# Patient Record
Sex: Female | Born: 1940 | Race: White | Hispanic: No | Marital: Married | Smoking: Former smoker
Health system: Southern US, Community
[De-identification: ages and names within clinical notes are randomized; demographics above are authoritative.]

## PROBLEM LIST (undated history)

## (undated) DIAGNOSIS — K589 Irritable bowel syndrome without diarrhea: Secondary | ICD-10-CM

## (undated) DIAGNOSIS — R413 Other amnesia: Secondary | ICD-10-CM

## (undated) DIAGNOSIS — K219 Gastro-esophageal reflux disease without esophagitis: Secondary | ICD-10-CM

## (undated) DIAGNOSIS — I10 Essential (primary) hypertension: Secondary | ICD-10-CM

## (undated) DIAGNOSIS — F0280 Dementia in other diseases classified elsewhere without behavioral disturbance: Secondary | ICD-10-CM

## (undated) DIAGNOSIS — E785 Hyperlipidemia, unspecified: Secondary | ICD-10-CM

## (undated) DIAGNOSIS — M797 Fibromyalgia: Secondary | ICD-10-CM

## (undated) DIAGNOSIS — F419 Anxiety disorder, unspecified: Secondary | ICD-10-CM

## (undated) HISTORY — PX: GYNECOLOGIC CRYOSURGERY: SHX857

## (undated) HISTORY — PX: ESOPHAGOGASTRODUODENOSCOPY: SHX1529

## (undated) HISTORY — PX: TUBAL LIGATION: SHX77

## (undated) HISTORY — PX: COLONOSCOPY W/ POLYPECTOMY: SHX1380

## (undated) HISTORY — PX: BREAST BIOPSY: SHX20

---

## 2005-05-30 ENCOUNTER — Ambulatory Visit: Payer: Self-pay | Admitting: Unknown Physician Specialty

## 2005-10-10 ENCOUNTER — Emergency Department: Payer: Self-pay | Admitting: Emergency Medicine

## 2005-11-06 ENCOUNTER — Ambulatory Visit: Payer: Self-pay | Admitting: Internal Medicine

## 2006-11-13 ENCOUNTER — Ambulatory Visit: Payer: Self-pay | Admitting: Internal Medicine

## 2007-11-24 ENCOUNTER — Ambulatory Visit: Payer: Self-pay | Admitting: Internal Medicine

## 2008-09-19 ENCOUNTER — Ambulatory Visit: Payer: Self-pay | Admitting: Unknown Physician Specialty

## 2008-12-01 ENCOUNTER — Ambulatory Visit: Payer: Self-pay | Admitting: Internal Medicine

## 2009-12-22 ENCOUNTER — Ambulatory Visit: Payer: Self-pay | Admitting: Internal Medicine

## 2011-01-10 ENCOUNTER — Ambulatory Visit: Payer: Self-pay | Admitting: Internal Medicine

## 2012-03-10 ENCOUNTER — Ambulatory Visit: Payer: Self-pay | Admitting: Internal Medicine

## 2013-03-11 ENCOUNTER — Ambulatory Visit: Payer: Self-pay | Admitting: Internal Medicine

## 2013-10-30 ENCOUNTER — Emergency Department: Payer: Self-pay | Admitting: Emergency Medicine

## 2014-03-14 ENCOUNTER — Ambulatory Visit: Payer: Self-pay | Admitting: Internal Medicine

## 2014-09-06 ENCOUNTER — Encounter: Payer: Self-pay | Admitting: *Deleted

## 2014-09-09 ENCOUNTER — Ambulatory Visit
Admission: RE | Admit: 2014-09-09 | Payer: Medicare Other | Source: Ambulatory Visit | Admitting: Unknown Physician Specialty

## 2014-09-09 ENCOUNTER — Encounter: Admission: RE | Payer: Self-pay | Source: Ambulatory Visit

## 2014-09-09 HISTORY — DX: Fibromyalgia: M79.7

## 2014-09-09 HISTORY — DX: Hyperlipidemia, unspecified: E78.5

## 2014-09-09 HISTORY — DX: Irritable bowel syndrome, unspecified: K58.9

## 2014-09-09 HISTORY — DX: Gastro-esophageal reflux disease without esophagitis: K21.9

## 2014-09-09 SURGERY — COLONOSCOPY
Anesthesia: General

## 2014-10-21 ENCOUNTER — Ambulatory Visit: Payer: Medicare Other | Admitting: Anesthesiology

## 2014-10-21 ENCOUNTER — Ambulatory Visit
Admission: RE | Admit: 2014-10-21 | Discharge: 2014-10-21 | Disposition: A | Discharge status: Home / Self Care | Payer: Medicare Other | Source: Ambulatory Visit | Attending: Unknown Physician Specialty | Admitting: Unknown Physician Specialty

## 2014-10-21 ENCOUNTER — Encounter
Admission: RE | Disposition: A | Discharge status: Home / Self Care | Payer: Self-pay | Source: Ambulatory Visit | Attending: Unknown Physician Specialty

## 2014-10-21 ENCOUNTER — Encounter: Payer: Self-pay | Admitting: *Deleted

## 2014-10-21 DIAGNOSIS — K648 Other hemorrhoids: Secondary | ICD-10-CM | POA: Insufficient documentation

## 2014-10-21 DIAGNOSIS — K5730 Diverticulosis of large intestine without perforation or abscess without bleeding: Secondary | ICD-10-CM | POA: Diagnosis not present

## 2014-10-21 DIAGNOSIS — D125 Benign neoplasm of sigmoid colon: Secondary | ICD-10-CM | POA: Insufficient documentation

## 2014-10-21 DIAGNOSIS — D122 Benign neoplasm of ascending colon: Secondary | ICD-10-CM | POA: Diagnosis present

## 2014-10-21 DIAGNOSIS — E785 Hyperlipidemia, unspecified: Secondary | ICD-10-CM | POA: Diagnosis not present

## 2014-10-21 HISTORY — PX: COLONOSCOPY WITH PROPOFOL: SHX5780

## 2014-10-21 SURGERY — COLONOSCOPY WITH PROPOFOL
Anesthesia: General

## 2014-10-21 MED ORDER — SODIUM CHLORIDE 0.9 % IV SOLN
INTRAVENOUS | Status: DC
  Administered 2014-10-21: 12:00:00 via INTRAVENOUS

## 2014-10-21 MED ORDER — SODIUM CHLORIDE 0.9 % IV SOLN
INTRAVENOUS | Status: DC
  Administered 2014-10-21: 1000 mL via INTRAVENOUS

## 2014-10-21 MED ORDER — MIDAZOLAM HCL 2 MG/2ML IJ SOLN
INTRAMUSCULAR | Status: DC | PRN
  Administered 2014-10-21: 1 mg via INTRAVENOUS

## 2014-10-21 MED ORDER — PROPOFOL INFUSION 10 MG/ML OPTIME
INTRAVENOUS | Status: DC | PRN
  Administered 2014-10-21: 120 ug/kg/min via INTRAVENOUS

## 2014-10-21 NOTE — Op Note (Signed)
[facility] Gastroenterology Patient Name: Lindsay Chung Procedure Date: 10/21/2014 11:13 AM MRN: [id number] Account #: [id number] Date of Birth: 1941-01-14 Admit Type: Outpatient Age: 74 Room: [facility] ENDO ROOM 1 Gender: Female Note Status: Finalized Procedure:         Colonoscopy Indications:       Personal history of colonic polyps Providers:         Manya Silvas, MD Referring MD:      No Local Md, MD (Referring MD) Medicines:         Propofol per Anesthesia Complications:     No immediate complications. Procedure:         Pre-Anesthesia Assessment:                    - After reviewing the risks and benefits, the patient was                     deemed in satisfactory condition to undergo the procedure.                    After obtaining informed consent, the colonoscope was                     passed under direct vision. Throughout the procedure, the                     patient's blood pressure, pulse, and oxygen saturations                     were monitored continuously. The Colonoscope was                     introduced through the anus and advanced to the the cecum,                     identified by appendiceal orifice and ileocecal valve. The                     colonoscopy was technically difficult and complex due to                     restricted mobility of the colon and a tortuous colon.                     Successful completion of the procedure was aided by                     applying abdominal pressure. The patient tolerated the                     procedure well. The quality of the bowel preparation was                     good. Findings:      A diminutive polyp was found in the ascending colon. The polyp was       sessile. The polyp was removed with a jumbo cold forceps. Resection and       retrieval were complete.      A diminutive polyp was found in the ascending colon. The polyp was       sessile. The polyp was removed with a jumbo cold  forceps. Resection and       retrieval were complete.      A diminutive polyp was  found in the sigmoid colon. The polyp was       sessile. The polyp was removed with a jumbo cold forceps. Resection and       retrieval were complete.      A diminutive polyp was found in the sigmoid colon. The polyp was       sessile. The polyp was removed with a jumbo cold forceps. Resection and       retrieval were complete.      Multiple small-mouthed diverticula were found in the sigmoid colon, in       the descending colon, in the transverse colon and in the ascending colon.      The exam was otherwise without abnormality.      External and internal hemorrhoids were found during endoscopy. The       hemorrhoids were small and Grade II (internal hemorrhoids that prolapse       but reduce spontaneously).      The exam was otherwise without abnormality. Impression:        - One diminutive polyp in the ascending colon. Resected                     and retrieved.                    - One diminutive polyp in the ascending colon. Resected                     and retrieved.                    - One diminutive polyp in the sigmoid colon. Resected and                     retrieved.                    - One diminutive polyp in the sigmoid colon. Resected and                     retrieved.                    - Diverticulosis in the sigmoid colon, in the descending                     colon, in the transverse colon and in the ascending colon.                    - Internal hemorrhoids.                    - The examination was otherwise normal. Recommendation:    - Await pathology results.                    - The findings and recommendations were discussed with the                     patient's family. Manya Silvas, MD 10/21/2014 12:13:06 PM This report has been signed electronically. Number of Addenda: 0 Note Initiated On: 10/21/2014 11:13 AM Scope Withdrawal Time: 0 hours 20 minutes 24 seconds  Total  Procedure Duration: 0 hours 30 minutes 16 seconds       [facility]

## 2014-10-21 NOTE — H&P (Signed)
Primary Care Physician:  Brynda Greathouse, MD Primary Gastroenterologist:  Dr. Vira Agar  Pre-Procedure History & Physical: HPI:  Lindsay Chung is a 74 y.o. female is here for an colonoscopy.   Past Medical History  Diagnosis Date  . Hyperlipidemia   . GERD (gastroesophageal reflux disease)   . Fibromyalgia   . Irritable bowel syndrome     Past Surgical History  Procedure Laterality Date  . Colonoscopy w/ polypectomy    . Tubal ligation    . Gynecologic cryosurgery    . Esophagogastroduodenoscopy      Prior to Admission medications   Medication Sig Start Date End Date Taking? Authorizing Provider  acetaminophen (TYLENOL) 500 MG tablet Take 1,000 mg by mouth every 6 (six) hours as needed for mild pain.    Historical Provider, MD  ALPRAZolam Duanne Moron) 0.5 MG tablet Take 0.5 mg by mouth 2 (two) times daily. 08/10/14   Historical Provider, MD  aspirin 81 MG tablet Take 81 mg by mouth daily.    Historical Provider, MD  cetirizine (ZYRTEC) 10 MG tablet Take 10 mg by mouth daily.    Historical Provider, MD  cholecalciferol (VITAMIN D) 1000 UNITS tablet Take 1,000 Units by mouth daily.    Historical Provider, MD  FLUoxetine (PROZAC) 20 MG capsule Take 20 mg by mouth 2 (two) times daily. 05/25/14   Historical Provider, MD  fluvastatin (LESCOL) 20 MG capsule Take 20 mg by mouth at bedtime.    Historical Provider, MD  metoprolol tartrate (LOPRESSOR) 25 MG tablet Take 25 mg by mouth daily. 05/25/14   Historical Provider, MD  omeprazole (PRILOSEC) 20 MG capsule Take 20 mg by mouth daily. 06/12/14   Historical Provider, MD  ondansetron (ZOFRAN-ODT) 4 MG disintegrating tablet Take 1 tablet by mouth every 8 (eight) hours as needed for nausea.  07/13/14   Historical Provider, MD  polyethylene glycol-electrolytes (NULYTELY/GOLYTELY) 420 G solution Take 4,000 mLs by mouth once. 07/13/14   Historical Provider, MD    Allergies as of 09/26/2014  . (No Known Allergies)    History reviewed. No pertinent family  history.  History   Social History  . Marital Status: Married    Spouse Name: N/A  . Number of Children: N/A  . Years of Education: N/A   Occupational History  . Not on file.   Social History Main Topics  . Smoking status: Not on file  . Smokeless tobacco: Not on file  . Alcohol Use: Not on file  . Drug Use: Not on file  . Sexual Activity: Not on file   Other Topics Concern  . Not on file   Social History Narrative    Review of Systems: See HPI, otherwise negative ROS  Physical Exam: BP 140/72 mmHg  Pulse 70  Temp(Src) 96.8 F (36 C) (Tympanic)  Resp 17  Ht 5\' 2"  (1.575 m)  SpO2 98% General:   Alert,  pleasant and cooperative in NAD Head:  Normocephalic and atraumatic. Neck:  Supple; no masses or thyromegaly. Lungs:  Clear throughout to auscultation.    Heart:  Regular rate and rhythm. Abdomen:  Soft, nontender and nondistended. Normal bowel sounds, without guarding, and without rebound.   Neurologic:  Alert and  oriented x4;  grossly normal neurologically.  Impression/Plan: Lindsay Chung is here for an colonoscopy to be performed for personal history of colon polyps  Risks, benefits, limitations, and alternatives regarding  colonoscopy have Chung reviewed with the patient.  Questions have Chung answered.  All parties agreeable.  Gaylyn Cheers, MD  10/21/2014, 11:14 AM   Primary Care Physician:  Brynda Greathouse, MD Primary Gastroenterologist:  Dr. Vira Agar  Pre-Procedure History & Physical: HPI:  Lindsay Chung is a 74 y.o. female is here for an colonoscopy.   Past Medical History  Diagnosis Date  . Hyperlipidemia   . GERD (gastroesophageal reflux disease)   . Fibromyalgia   . Irritable bowel syndrome     Past Surgical History  Procedure Laterality Date  . Colonoscopy w/ polypectomy    . Tubal ligation    . Gynecologic cryosurgery    . Esophagogastroduodenoscopy      Prior to Admission medications   Medication Sig Start Date End Date Taking?  Authorizing Provider  acetaminophen (TYLENOL) 500 MG tablet Take 1,000 mg by mouth every 6 (six) hours as needed for mild pain.    Historical Provider, MD  ALPRAZolam Duanne Moron) 0.5 MG tablet Take 0.5 mg by mouth 2 (two) times daily. 08/10/14   Historical Provider, MD  aspirin 81 MG tablet Take 81 mg by mouth daily.    Historical Provider, MD  cetirizine (ZYRTEC) 10 MG tablet Take 10 mg by mouth daily.    Historical Provider, MD  cholecalciferol (VITAMIN D) 1000 UNITS tablet Take 1,000 Units by mouth daily.    Historical Provider, MD  FLUoxetine (PROZAC) 20 MG capsule Take 20 mg by mouth 2 (two) times daily. 05/25/14   Historical Provider, MD  fluvastatin (LESCOL) 20 MG capsule Take 20 mg by mouth at bedtime.    Historical Provider, MD  metoprolol tartrate (LOPRESSOR) 25 MG tablet Take 25 mg by mouth daily. 05/25/14   Historical Provider, MD  omeprazole (PRILOSEC) 20 MG capsule Take 20 mg by mouth daily. 06/12/14   Historical Provider, MD  ondansetron (ZOFRAN-ODT) 4 MG disintegrating tablet Take 1 tablet by mouth every 8 (eight) hours as needed for nausea.  07/13/14   Historical Provider, MD  polyethylene glycol-electrolytes (NULYTELY/GOLYTELY) 420 G solution Take 4,000 mLs by mouth once. 07/13/14   Historical Provider, MD    Allergies as of 09/26/2014  . (No Known Allergies)    History reviewed. No pertinent family history.  History   Social History  . Marital Status: Married    Spouse Name: N/A  . Number of Children: N/A  . Years of Education: N/A   Occupational History  . Not on file.   Social History Main Topics  . Smoking status: Not on file  . Smokeless tobacco: Not on file  . Alcohol Use: Not on file  . Drug Use: Not on file  . Sexual Activity: Not on file   Other Topics Concern  . Not on file   Social History Narrative    Review of Systems: See HPI, otherwise negative ROS  Physical Exam: BP 140/72 mmHg  Pulse 70  Temp(Src) 96.8 F (36 C) (Tympanic)  Resp 17  Ht 5'  2" (1.575 m)  SpO2 98% General:   Alert,  pleasant and cooperative in NAD Head:  Normocephalic and atraumatic. Neck:  Supple; no masses or thyromegaly. Lungs:  Clear throughout to auscultation.    Heart:  Regular rate and rhythm. Abdomen:  Soft, nontender and nondistended. Normal bowel sounds, without guarding, and without rebound.   Neurologic:  Alert and  oriented x4;  grossly normal neurologically.  Impression/Plan: Lindsay Chung is here for an colonoscopy to be performed for personal history of colon polyps  Risks, benefits, limitations, and alternatives regarding  colonoscopy have Chung reviewed with the patient.  Questions have Chung answered.  All parties agreeable.   Gaylyn Cheers, MD  10/21/2014, 11:14 AM

## 2014-10-21 NOTE — Anesthesia Preprocedure Evaluation (Addendum)
Anesthesia Evaluation  Patient identified by MRN, date of birth, ID band Patient awake    Reviewed: Allergy & Precautions, NPO status , Patient's Chart, lab work & pertinent test results, reviewed documented beta blocker date and time   Airway Mallampati: II  TM Distance: >3 FB     Dental  (+) Chipped   Pulmonary          Cardiovascular hypertension, Pt. on home beta blockers     Neuro/Psych  Neuromuscular disease    GI/Hepatic GERD-  ,  Endo/Other    Renal/GU      Musculoskeletal  (+) Fibromyalgia -  Abdominal   Peds  Hematology   Anesthesia Other Findings   Reproductive/Obstetrics                            Anesthesia Physical Anesthesia Plan  ASA: II  Anesthesia Plan: General   Post-op Pain Management:    Induction: Intravenous  Airway Management Planned: Nasal Cannula  Additional Equipment:   Intra-op Plan:   Post-operative Plan:   Informed Consent: I have reviewed the patients History and Physical, chart, labs and discussed the procedure including the risks, benefits and alternatives for the proposed anesthesia with the patient or authorized representative who has indicated his/her understanding and acceptance.     Plan Discussed with: CRNA  Anesthesia Plan Comments:         Anesthesia Quick Evaluation

## 2014-10-21 NOTE — Transfer of Care (Signed)
Immediate Anesthesia Transfer of Care Note  Patient: Lindsay Chung  Procedure(s) Performed: Procedure(s): COLONOSCOPY WITH PROPOFOL (N/A)  Patient Location: PACU and Endoscopy Unit  Anesthesia Type:General  Level of Consciousness: sedated, patient cooperative and responds to stimulation  Airway & Oxygen Therapy: Patient Spontanous Breathing and Patient connected to nasal cannula oxygen  Post-op Assessment: Report given to RN and Post -op Vital signs reviewed and stable  Post vital signs: Reviewed and stable  Last Vitals:  Filed Vitals:   10/21/14 1215  BP: 103/62  Pulse: 70  Temp: 36.5 C  Resp: 16    Complications: No apparent anesthesia complications

## 2014-10-21 NOTE — Anesthesia Postprocedure Evaluation (Signed)
  Anesthesia Post-op Note  Patient: Lindsay Chung  Procedure(s) Performed: Procedure(s): COLONOSCOPY WITH PROPOFOL (N/A)  Anesthesia type:General  Patient location: PACU  Post pain: Pain level controlled  Post assessment: Post-op Vital signs reviewed, Patient's Cardiovascular Status Stable, Respiratory Function Stable, Patent Airway and No signs of Nausea or vomiting  Post vital signs: Reviewed and stable  Last Vitals:  Filed Vitals:   10/21/14 1240  BP: 102/62  Pulse: 64  Temp:   Resp: 19    Level of consciousness: awake, alert  and patient cooperative  Complications: No apparent anesthesia complications

## 2014-10-24 ENCOUNTER — Encounter: Payer: Self-pay | Admitting: Unknown Physician Specialty

## 2014-10-25 LAB — SURGICAL PATHOLOGY

## 2016-04-12 ENCOUNTER — Other Ambulatory Visit: Payer: Self-pay | Admitting: Internal Medicine

## 2016-04-12 DIAGNOSIS — Z1231 Encounter for screening mammogram for malignant neoplasm of breast: Secondary | ICD-10-CM

## 2016-05-20 ENCOUNTER — Ambulatory Visit
Admission: RE | Admit: 2016-05-20 | Discharge: 2016-05-20 | Disposition: A | Discharge status: Home / Self Care | Payer: Medicare Other | Source: Ambulatory Visit | Attending: Internal Medicine | Admitting: Internal Medicine

## 2016-05-20 DIAGNOSIS — Z1231 Encounter for screening mammogram for malignant neoplasm of breast: Secondary | ICD-10-CM | POA: Insufficient documentation

## 2017-04-11 ENCOUNTER — Other Ambulatory Visit: Payer: Self-pay | Admitting: Internal Medicine

## 2017-04-11 DIAGNOSIS — Z1231 Encounter for screening mammogram for malignant neoplasm of breast: Secondary | ICD-10-CM

## 2017-05-30 ENCOUNTER — Ambulatory Visit
Admission: RE | Admit: 2017-05-30 | Discharge: 2017-05-30 | Disposition: A | Discharge status: Home / Self Care | Payer: Medicare Other | Source: Ambulatory Visit | Attending: Internal Medicine | Admitting: Internal Medicine

## 2017-05-30 DIAGNOSIS — Z1231 Encounter for screening mammogram for malignant neoplasm of breast: Secondary | ICD-10-CM | POA: Diagnosis present

## 2018-05-11 ENCOUNTER — Other Ambulatory Visit: Payer: Self-pay | Admitting: Internal Medicine

## 2018-05-11 DIAGNOSIS — Z1231 Encounter for screening mammogram for malignant neoplasm of breast: Secondary | ICD-10-CM

## 2018-06-01 ENCOUNTER — Other Ambulatory Visit: Payer: Self-pay | Admitting: Internal Medicine

## 2018-06-01 ENCOUNTER — Ambulatory Visit
Admission: RE | Admit: 2018-06-01 | Discharge: 2018-06-01 | Disposition: A | Discharge status: Home / Self Care | Payer: Medicare Other | Source: Ambulatory Visit | Attending: Internal Medicine | Admitting: Internal Medicine

## 2018-06-01 DIAGNOSIS — Z1231 Encounter for screening mammogram for malignant neoplasm of breast: Secondary | ICD-10-CM | POA: Insufficient documentation

## 2018-06-01 DIAGNOSIS — R928 Other abnormal and inconclusive findings on diagnostic imaging of breast: Secondary | ICD-10-CM

## 2018-06-01 DIAGNOSIS — N6489 Other specified disorders of breast: Secondary | ICD-10-CM

## 2018-06-01 DIAGNOSIS — N6453 Retraction of nipple: Secondary | ICD-10-CM

## 2018-06-10 ENCOUNTER — Ambulatory Visit
Admission: RE | Admit: 2018-06-10 | Discharge: 2018-06-10 | Disposition: A | Discharge status: Home / Self Care | Payer: Medicare Other | Source: Ambulatory Visit | Attending: Internal Medicine | Admitting: Internal Medicine

## 2018-06-10 ENCOUNTER — Other Ambulatory Visit: Payer: Self-pay

## 2018-06-10 DIAGNOSIS — R928 Other abnormal and inconclusive findings on diagnostic imaging of breast: Secondary | ICD-10-CM

## 2018-06-10 DIAGNOSIS — N6489 Other specified disorders of breast: Secondary | ICD-10-CM | POA: Insufficient documentation

## 2018-06-10 DIAGNOSIS — N6453 Retraction of nipple: Secondary | ICD-10-CM

## 2018-06-12 ENCOUNTER — Other Ambulatory Visit: Payer: Self-pay | Admitting: Internal Medicine

## 2018-06-12 DIAGNOSIS — N6489 Other specified disorders of breast: Secondary | ICD-10-CM

## 2018-06-12 DIAGNOSIS — R928 Other abnormal and inconclusive findings on diagnostic imaging of breast: Secondary | ICD-10-CM

## 2018-06-16 ENCOUNTER — Other Ambulatory Visit: Payer: Self-pay

## 2018-06-16 ENCOUNTER — Ambulatory Visit
Admission: RE | Admit: 2018-06-16 | Discharge: 2018-06-16 | Disposition: A | Discharge status: Home / Self Care | Payer: Medicare Other | Source: Ambulatory Visit | Attending: Internal Medicine | Admitting: Internal Medicine

## 2018-06-16 DIAGNOSIS — R928 Other abnormal and inconclusive findings on diagnostic imaging of breast: Secondary | ICD-10-CM | POA: Diagnosis present

## 2018-06-16 DIAGNOSIS — N6489 Other specified disorders of breast: Secondary | ICD-10-CM

## 2018-06-16 HISTORY — PX: BREAST BIOPSY: SHX20

## 2018-06-17 LAB — SURGICAL PATHOLOGY

## 2018-10-05 ENCOUNTER — Emergency Department
Admission: EM | Admit: 2018-10-05 | Discharge: 2018-10-05 | Disposition: A | Discharge status: Home / Self Care | Payer: Medicare Other | Attending: Emergency Medicine | Admitting: Emergency Medicine

## 2018-10-05 ENCOUNTER — Emergency Department: Payer: Medicare Other

## 2018-10-05 ENCOUNTER — Encounter: Payer: Self-pay | Admitting: Emergency Medicine

## 2018-10-05 ENCOUNTER — Other Ambulatory Visit: Payer: Self-pay

## 2018-10-05 DIAGNOSIS — F17200 Nicotine dependence, unspecified, uncomplicated: Secondary | ICD-10-CM | POA: Insufficient documentation

## 2018-10-05 DIAGNOSIS — Z79899 Other long term (current) drug therapy: Secondary | ICD-10-CM | POA: Insufficient documentation

## 2018-10-05 DIAGNOSIS — F419 Anxiety disorder, unspecified: Secondary | ICD-10-CM | POA: Diagnosis not present

## 2018-10-05 DIAGNOSIS — Z7982 Long term (current) use of aspirin: Secondary | ICD-10-CM | POA: Insufficient documentation

## 2018-10-05 DIAGNOSIS — R0602 Shortness of breath: Secondary | ICD-10-CM | POA: Insufficient documentation

## 2018-10-05 DIAGNOSIS — R0600 Dyspnea, unspecified: Secondary | ICD-10-CM

## 2018-10-05 HISTORY — DX: Anxiety disorder, unspecified: F41.9

## 2018-10-05 LAB — CBC
HCT: 38.6 % (ref 36.0–46.0)
Hemoglobin: 13 g/dL (ref 12.0–15.0)
MCH: 31.6 pg (ref 26.0–34.0)
MCHC: 33.7 g/dL (ref 30.0–36.0)
MCV: 93.7 fL (ref 80.0–100.0)
Platelets: 215 10*3/uL (ref 150–400)
RBC: 4.12 MIL/uL (ref 3.87–5.11)
RDW: 13.5 % (ref 11.5–15.5)
WBC: 6.2 10*3/uL (ref 4.0–10.5)
nRBC: 0 % (ref 0.0–0.2)

## 2018-10-05 LAB — BASIC METABOLIC PANEL
Anion gap: 9 (ref 5–15)
BUN: 9 mg/dL (ref 8–23)
CO2: 25 mmol/L (ref 22–32)
Calcium: 6.9 mg/dL — ABNORMAL LOW (ref 8.9–10.3)
Chloride: 109 mmol/L (ref 98–111)
Creatinine, Ser: 0.8 mg/dL (ref 0.44–1.00)
GFR calc Af Amer: >60 mL/min (ref >60)
GFR calc non Af Amer: >60 mL/min (ref >60)
Glucose, Bld: 118 mg/dL — ABNORMAL HIGH (ref 70–99)
Potassium: 3 mmol/L — ABNORMAL LOW (ref 3.5–5.1)
Sodium: 143 mmol/L (ref 135–145)

## 2018-10-05 LAB — TROPONIN I (HIGH SENSITIVITY)
Troponin I (High Sensitivity): 10 ng/L (ref <18)
Troponin I (High Sensitivity): 10 ng/L (ref <18)

## 2018-10-05 NOTE — ED Notes (Signed)
Pt ambulating around waiting room with slow steady gait;

## 2018-10-05 NOTE — ED Provider Notes (Signed)
[facility] Emergency Department Provider Note  Time seen: 7:36 AM  I have reviewed the triage vital signs and the nursing notes.   HISTORY  Chief Complaint Shortness of Breath and Anxiety   HPI Lindsay Chung is a 78 y.o. female with a past medical history anxiety, fibromyalgia, gastric reflux, hyperlipidemia presents to the emergency department for shortness of breath.  According to the patient overnight she developed some mild shortness of breath.  She states she is not sure if it was her panic attack or if it was something to do with her heart or lungs.  Patient waited several hours in the waiting room before my evaluation, currently she states no difficulty breathing whatsoever, no chest pain.  Patient denies any recent fever cough or congestion.   Denies any chest pain now or at any point.  Past Medical History:  Diagnosis Date  . Anxiety   . Fibromyalgia   . GERD (gastroesophageal reflux disease)   . Hyperlipidemia   . Irritable bowel syndrome     There are no active problems to display for this patient.   Past Surgical History:  Procedure Laterality Date  . BREAST BIOPSY Left    neg  . BREAST BIOPSY Left 06/16/2018   affirm stereo biopsy/ x clip/ path pending  . COLONOSCOPY W/ POLYPECTOMY    . COLONOSCOPY WITH PROPOFOL N/A 10/21/2014   Procedure: COLONOSCOPY WITH PROPOFOL;  Surgeon: Manya Silvas, MD;  Location: [facility] ENDOSCOPY;  Service: Endoscopy;  Laterality: N/A;  . ESOPHAGOGASTRODUODENOSCOPY    . GYNECOLOGIC CRYOSURGERY    . TUBAL LIGATION      Prior to Admission medications   Medication Sig Start Date End Date Taking? Authorizing Provider  acetaminophen (TYLENOL) 500 MG tablet Take 1,000 mg by mouth every 6 (six) hours as needed for mild pain.    [provider]  ALPRAZolam Duanne Moron) 0.5 MG tablet Take 0.5 mg by mouth 2 (two) times daily. 08/10/14   [provider]  aspirin 81 MG tablet Take 81 mg by mouth daily.     [provider]  cetirizine (ZYRTEC) 10 MG tablet Take 10 mg by mouth daily.    [provider]  cholecalciferol (VITAMIN D) 1000 UNITS tablet Take 1,000 Units by mouth daily.    [provider]  FLUoxetine (PROZAC) 20 MG capsule Take 20 mg by mouth 2 (two) times daily. 05/25/14   [provider]  fluvastatin (LESCOL) 20 MG capsule Take 20 mg by mouth at bedtime.    [provider]  metoprolol tartrate (LOPRESSOR) 25 MG tablet Take 25 mg by mouth daily. 05/25/14   [provider]  omeprazole (PRILOSEC) 20 MG capsule Take 20 mg by mouth daily. 06/12/14   [provider]  ondansetron (ZOFRAN-ODT) 4 MG disintegrating tablet Take 1 tablet by mouth every 8 (eight) hours as needed for nausea.  07/13/14   [provider]  polyethylene glycol-electrolytes (NULYTELY/GOLYTELY) 420 G solution Take 4,000 mLs by mouth once. 07/13/14   [provider]    No Known Allergies  Family History  Problem Relation Age of Onset  . Breast cancer Neg Hx     Social History Social History   Tobacco Use  . Smoking status: Current Every Day Smoker  . Smokeless tobacco: Never Used  Substance Use Topics  . Alcohol use: Not on file  . Drug use: Not on file    Review of Systems Constitutional: Negative for fever. Cardiovascular: Negative for chest pain.  Respiratory: Breath overnight, now resolved Gastrointestinal: Negative for abdominal pain, vomiting  Musculoskeletal: Negative for musculoskeletal complaints Skin: Negative for skin complaints  Neurological: Negative for headache All other ROS negative  ____________________________________________   PHYSICAL EXAM:  VITAL SIGNS: ED Triage Vitals  Enc Vitals Group     BP 10/05/18 0500 135/84     Pulse Rate 10/05/18 0500 68     Resp 10/05/18 0500 18     Temp 10/05/18 0500 98.9 F (37.2 C)     Temp Source 10/05/18 0500 Oral     SpO2 10/05/18 0454 100 %     Weight 10/05/18  0500 180 lb (81.6 kg)     Height 10/05/18 0500 5\' 3"  (1.6 m)     Head Circumference --      Peak Flow --      Pain Score 10/05/18 0500 0     Pain Loc --      Pain Edu? --      Excl. in [place]? --    Constitutional: Alert and oriented. Well appearing and in no distress. Eyes: Normal exam ENT      Head: Normocephalic and atraumatic.      Mouth/Throat: Mucous membranes are moist. Cardiovascular: Normal rate, regular rhythm. Respiratory: Normal respiratory effort without tachypnea nor retractions. Breath sounds are clear  Gastrointestinal: Soft and nontender. No distention.  Musculoskeletal: Nontender with normal range of motion in all extremities.  Neurologic:  Normal speech and language. No gross focal neurologic deficits  Skin:  Skin is warm, dry and intact.  Psychiatric: Mood and affect are normal.  ____________________________________________    EKG  EKG viewed and interpreted by myself shows a normal sinus rhythm at 70 bpm with a narrow QRS, normal axis, normal intervals, no concerning ST changes.  ____________________________________________    RADIOLOGY  Chest x-ray shows cardiomegaly without acute finding.  ____________________________________________   INITIAL IMPRESSION / ASSESSMENT AND PLAN / ED COURSE  Pertinent labs & imaging results that were available during my care of the patient were reviewed by me and considered in my medical decision making (see chart for details).   Patient presents to the emergency department for shortness of breath since early this morning.  Patient states she thinks it could have been 1 of her "panic attacks."  Differential would include anxiety, ACS, pneumonia, pneumothorax, URI.  Patient denies any difficulty breathing right now.  Denies any chest pain currently or at any point overnight.  Overall the patient appears well, she is asking when she can be discharged home.  Patient's lab work is thus far nonrevealing.  Given the patient's  complaint of shortness of breath overnight we will repeat a troponin as a precaution.  If the patient's repeat troponin remains negative anticipate likely discharge home with PCP follow-up.  Patient is agreeable to this plan of care.   Patient's repeat troponin is unchanged.  Patient continues to feel well with no dyspnea.  We will discharge from the emergency department with PCP follow-up.  JILENE SPOHR was evaluated in Emergency Department on 10/05/2018 for the symptoms described in the history of present illness. She was evaluated in the context of the global COVID-19 pandemic, which necessitated consideration that the patient might be at risk for infection with the SARS-CoV-2 virus that causes COVID-19. Institutional protocols and algorithms that pertain to the evaluation of patients at risk for COVID-19 are in a state of rapid change based on information released by regulatory bodies including the CDC and federal and state  organizations. These policies and algorithms were followed during the patient's care in the ED.  ____________________________________________   FINAL CLINICAL IMPRESSION(S) / ED DIAGNOSES  Dyspnea   Harvest Dark, MD 10/05/18 [phone]

## 2018-10-05 NOTE — ED Triage Notes (Signed)
EMS pt from home brought out to waiting room via wheelchair; pt reports trouble breathing with arm tingling; EMS had actually been out to pt's house earlier in the evening for the same; pt with history of anxiety and says this is what it feels like tonight; NSR on the monitor; pt arrives awake and alert

## 2018-10-09 ENCOUNTER — Other Ambulatory Visit: Payer: Self-pay

## 2018-10-09 ENCOUNTER — Emergency Department
Admission: EM | Admit: 2018-10-09 | Discharge: 2018-10-09 | Disposition: A | Discharge status: Home / Self Care | Payer: Medicare Other | Attending: Emergency Medicine | Admitting: Emergency Medicine

## 2018-10-09 DIAGNOSIS — R61 Generalized hyperhidrosis: Secondary | ICD-10-CM | POA: Diagnosis present

## 2018-10-09 DIAGNOSIS — Z79899 Other long term (current) drug therapy: Secondary | ICD-10-CM | POA: Insufficient documentation

## 2018-10-09 DIAGNOSIS — Z87891 Personal history of nicotine dependence: Secondary | ICD-10-CM | POA: Diagnosis not present

## 2018-10-09 DIAGNOSIS — Z7982 Long term (current) use of aspirin: Secondary | ICD-10-CM | POA: Diagnosis not present

## 2018-10-09 DIAGNOSIS — F410 Panic disorder [episodic paroxysmal anxiety] without agoraphobia: Secondary | ICD-10-CM

## 2018-10-09 NOTE — Discharge Instructions (Addendum)
You have been seen in the Emergency Department (ED) today for a variety of symptoms.  As we have discussed, we feel it is likely that panic attacks may be causing your symptoms.  Please follow up with the recommended doctor as instructed above in these documents regarding todays emergent visit and your recent symptoms to discuss further management.  Continue to take your regular medications. If you are not doing so already, consider taking a daily baby aspirin (81 mg), at least until you follow up with your doctor.  Return to the Emergency Department (ED) if you experience any further chest pain/pressure/tightness, difficulty breathing, or sudden sweating, or other symptoms that concern you.

## 2018-10-09 NOTE — ED Provider Notes (Signed)
[facility] Emergency Department Provider Note  ____________________________________________   First MD Initiated Contact with Patient 10/09/18 0510     (approximate)  I have reviewed the triage vital signs and the nursing notes.   HISTORY  Chief Complaint Excessive Sweating    HPI Lindsay Chung is a 78 y.o. female with medical history as listed below who presents for evaluation of rapid breathing, some tightness in her chest, and excessive sweating.  She says it feels like a panic attack.  She was seen a few days ago for similar symptoms and she also followed up with her primary care doctor.  Her primary care doctor started her on a medicine to help with anxiety which she took once or twice, but then she stopped taking it because she said it made her stomach feel bad.  She did not take a dose today and then the symptoms were worse tonight.  After coming to the emergency department they completely resolved as they did last time.  Nothing in particular made the symptoms better or worse.  She said that when the symptoms are going on she feels very worried but then she feels better once they have resolved.  She has not been in contact with any COVID-19 patients.  She is not having any pain, just a general sense of anxiety.  She says she is always suffering from anxiety but it seems like it is getting worse.         Past Medical History:  Diagnosis Date  . Anxiety   . Fibromyalgia   . GERD (gastroesophageal reflux disease)   . Hyperlipidemia   . Irritable bowel syndrome     There are no active problems to display for this patient.   Past Surgical History:  Procedure Laterality Date  . BREAST BIOPSY Left    neg  . BREAST BIOPSY Left 06/16/2018   affirm stereo biopsy/ x clip/ path pending  . COLONOSCOPY W/ POLYPECTOMY    . COLONOSCOPY WITH PROPOFOL N/A 10/21/2014   Procedure: COLONOSCOPY WITH PROPOFOL;  Surgeon: Manya Silvas, MD;  Location: [facility]  ENDOSCOPY;  Service: Endoscopy;  Laterality: N/A;  . ESOPHAGOGASTRODUODENOSCOPY    . GYNECOLOGIC CRYOSURGERY    . TUBAL LIGATION      Prior to Admission medications   Medication Sig Start Date End Date Taking? Authorizing Provider  acetaminophen (TYLENOL) 500 MG tablet Take 1,000 mg by mouth every 6 (six) hours as needed for mild pain.    [provider]  ALPRAZolam Duanne Moron) 0.5 MG tablet Take 0.5 mg by mouth 2 (two) times daily. 08/10/14   [provider]  aspirin 81 MG tablet Take 81 mg by mouth daily.    [provider]  cetirizine (ZYRTEC) 10 MG tablet Take 10 mg by mouth daily.    [provider]  Cholecalciferol (VITAMIN D) 50 MCG (2000 UT) tablet Take 2,000 Units by mouth daily.     [provider]  FLUoxetine (PROZAC) 20 MG capsule Take 20 mg by mouth 2 (two) times daily. 05/25/14   [provider]  fluvastatin (LESCOL) 40 MG capsule Take 40 mg by mouth at bedtime.     [provider]  metoprolol tartrate (LOPRESSOR) 25 MG tablet Take 25 mg by mouth 2 (two) times daily.     [provider]  omeprazole (PRILOSEC) 20 MG capsule Take 20 mg by mouth daily. 06/12/14   [provider]    Allergies Patient has no known allergies.  Family History  Problem Relation Age of Onset  . Breast cancer Neg Hx     Social History Social History   Tobacco Use  . Smoking status: Former Research scientist (life sciences)  . Smokeless tobacco: Never Used  Substance Use Topics  . Alcohol use: Not Currently  . Drug use: Not Currently    Review of Systems Constitutional: No fever/chills Eyes: No visual changes. ENT: No sore throat. Cardiovascular: No chest pain, some chest tightness when she is feeling anxious Respiratory: Shortness of breath when she is feeling anxious Gastrointestinal: No abdominal pain.  No nausea, no vomiting.  No diarrhea.  No constipation. Genitourinary: Negative for dysuria. Musculoskeletal: Negative for neck pain.   Negative for back pain. Integumentary: Negative for rash. Neurological: Negative for headaches, focal weakness or numbness.   ____________________________________________   PHYSICAL EXAM:  VITAL SIGNS: ED Triage Vitals  Enc Vitals Group     BP 10/09/18 0442 (!) 164/91     Pulse Rate 10/09/18 0442 81     Resp 10/09/18 0442 18     Temp 10/09/18 0442 97.8 F (36.6 C)     Temp Source 10/09/18 0442 Oral     SpO2 10/09/18 0442 100 %     Weight 10/09/18 0441 80 kg (176 lb 5.9 oz)     Height 10/09/18 0441 1.6 m (5\' 3" )     Head Circumference --      Peak Flow --      Pain Score 10/09/18 0440 0     Pain Loc --      Pain Edu? --      Excl. in [place]? --     Constitutional: Alert and oriented. Well appearing and in no acute distress. Eyes: Conjunctivae are normal.  Head: Atraumatic. Nose: No congestion/rhinnorhea. Mouth/Throat: Mucous membranes are moist. Neck: No stridor.  No meningeal signs.   Cardiovascular: Normal rate, regular rhythm. Good peripheral circulation. Grossly normal heart sounds. Respiratory: Normal respiratory effort.  No retractions. No audible wheezing. Gastrointestinal: Soft and nontender. No distention.  Musculoskeletal: No lower extremity tenderness nor edema. No gross deformities of extremities. Neurologic:  Normal speech and language. No gross focal neurologic deficits are appreciated.  Skin:  Skin is warm, dry and intact. No rash noted. Psychiatric: Mood and affect are normal. Speech and behavior are normal.  ____________________________________________   LABS (all labs ordered are listed, but only abnormal results are displayed)  Labs Reviewed - No data to display ____________________________________________  EKG  ED ECG REPORT I, Hinda Kehr, the attending physician, personally viewed and interpreted this ECG.  Date: 10/09/2018 EKG Time: 4:46 AM Rate: 75 Rhythm: normal sinus rhythm QRS Axis: normal Intervals: Slightly prolonged QTC of 529 ms  ST/T Wave abnormalities: Nonspecific ST changes including some slight depression in lead II as well as some mild ST depression in leads V4, V5, and V6, otherwise unremarkable. Narrative Interpretation: no evidence of acute ischemia.  No significant change from prior EKG a few days ago.  ____________________________________________  RADIOLOGY   ED MD interpretation: No indication for imaging  Official radiology report(s): No results found.  ____________________________________________   PROCEDURES   Procedure(s) performed (including Critical Care):  Procedures   ____________________________________________   INITIAL IMPRESSION / MDM / [place] / ED COURSE  As part of my medical decision making, I reviewed the following data within the [place] notes reviewed and incorporated, EKG interpreted , Old EKG reviewed, Old chart reviewed and Notes from prior ED visits   Differential  diagnosis includes, but is not limited to, anxiety, PE, ACS, medication side effects, dementia/delirium.  The patient is well-appearing and in no distress with stable vital signs.  She admits repeatedly to to being anxious.  She says she did not realize the panic attacks can make her feel like this.  I confirmed that her symptoms are the same as she had previously and about which she discussed with her primary care doctor.  Her EKG is reassuring and essentially unchanged from prior.  She is completely asymptomatic and has been asymptomatic since coming to the ED.  I offered to repeat all the lab work and even chest x-ray that was done before, but she declines.  I think this is appropriate because after I talked with her for a little while she seems more convinced that this is in fact anxiety/panic attacks whereas while she was at home she was concerned it might be something else.  On 2 separate occasions I offered to obtain lab work and perform a standard cardiac work-up but  she declines.  She is comfortable with the plan to go home.  She asked for contact information for psychiatry and I am giving her the name and number of [place].  I gave her strict return precautions and she understands and agrees with the plan.          ____________________________________________  FINAL CLINICAL IMPRESSION(S) / ED DIAGNOSES  Final diagnoses:  Panic attack     MEDICATIONS GIVEN DURING THIS VISIT:  Medications - No data to display   ED Discharge Orders    None      *Please note:  Lindsay Chung was evaluated in Emergency Department on 10/09/2018 for the symptoms described in the history of present illness. She was evaluated in the context of the global COVID-19 pandemic, which necessitated consideration that the patient might be at risk for infection with the SARS-CoV-2 virus that causes COVID-19. Institutional protocols and algorithms that pertain to the evaluation of patients at risk for COVID-19 are in a state of rapid change based on information released by regulatory bodies including the CDC and federal and state organizations. These policies and algorithms were followed during the patient's care in the ED.  Some ED evaluations and interventions may be delayed as a result of limited staffing during the pandemic.*  Note:  This document was prepared using Dragon voice recognition software and may include unintentional dictation errors.   Hinda Kehr, MD 10/09/18 [phone]

## 2018-10-09 NOTE — ED Triage Notes (Signed)
Pt to the er for excessive sweating and possible anxiety attack. Pt is poor historian. Pt seen here a few days ago and has followed up with her mD.

## 2019-01-27 ENCOUNTER — Other Ambulatory Visit: Payer: Self-pay | Admitting: Acute Care

## 2019-01-27 DIAGNOSIS — R413 Other amnesia: Secondary | ICD-10-CM

## 2019-02-09 ENCOUNTER — Other Ambulatory Visit: Payer: Self-pay

## 2019-02-09 ENCOUNTER — Ambulatory Visit
Admission: RE | Admit: 2019-02-09 | Discharge: 2019-02-09 | Disposition: A | Discharge status: Home / Self Care | Payer: Medicare Other | Source: Ambulatory Visit | Attending: Acute Care | Admitting: Acute Care

## 2019-02-09 DIAGNOSIS — R413 Other amnesia: Secondary | ICD-10-CM | POA: Diagnosis not present

## 2019-03-26 ENCOUNTER — Emergency Department: Payer: Medicare Other

## 2019-03-26 ENCOUNTER — Observation Stay: Payer: Medicare Other

## 2019-03-26 ENCOUNTER — Other Ambulatory Visit: Payer: Self-pay

## 2019-03-26 ENCOUNTER — Inpatient Hospital Stay
Admission: EM | Admit: 2019-03-26 | Discharge: 2019-03-28 | DRG: 641 | Disposition: A | Discharge status: Home Health | Payer: Medicare Other | Attending: Family Medicine | Admitting: Family Medicine

## 2019-03-26 ENCOUNTER — Encounter: Payer: Self-pay | Admitting: Emergency Medicine

## 2019-03-26 DIAGNOSIS — F411 Generalized anxiety disorder: Secondary | ICD-10-CM | POA: Diagnosis present

## 2019-03-26 DIAGNOSIS — F0390 Unspecified dementia without behavioral disturbance: Secondary | ICD-10-CM | POA: Diagnosis present

## 2019-03-26 DIAGNOSIS — F419 Anxiety disorder, unspecified: Secondary | ICD-10-CM | POA: Diagnosis present

## 2019-03-26 DIAGNOSIS — R630 Anorexia: Secondary | ICD-10-CM | POA: Diagnosis present

## 2019-03-26 DIAGNOSIS — M797 Fibromyalgia: Secondary | ICD-10-CM | POA: Diagnosis present

## 2019-03-26 DIAGNOSIS — Z79899 Other long term (current) drug therapy: Secondary | ICD-10-CM

## 2019-03-26 DIAGNOSIS — R197 Diarrhea, unspecified: Secondary | ICD-10-CM | POA: Diagnosis present

## 2019-03-26 DIAGNOSIS — Z881 Allergy status to other antibiotic agents status: Secondary | ICD-10-CM

## 2019-03-26 DIAGNOSIS — G9349 Other encephalopathy: Secondary | ICD-10-CM | POA: Diagnosis present

## 2019-03-26 DIAGNOSIS — K219 Gastro-esophageal reflux disease without esophagitis: Secondary | ICD-10-CM | POA: Diagnosis present

## 2019-03-26 DIAGNOSIS — K589 Irritable bowel syndrome without diarrhea: Secondary | ICD-10-CM | POA: Diagnosis present

## 2019-03-26 DIAGNOSIS — E876 Hypokalemia: Secondary | ICD-10-CM | POA: Diagnosis present

## 2019-03-26 DIAGNOSIS — E785 Hyperlipidemia, unspecified: Secondary | ICD-10-CM | POA: Diagnosis present

## 2019-03-26 DIAGNOSIS — Z87891 Personal history of nicotine dependence: Secondary | ICD-10-CM

## 2019-03-26 DIAGNOSIS — Z7982 Long term (current) use of aspirin: Secondary | ICD-10-CM

## 2019-03-26 DIAGNOSIS — R110 Nausea: Secondary | ICD-10-CM | POA: Diagnosis not present

## 2019-03-26 DIAGNOSIS — Z20828 Contact with and (suspected) exposure to other viral communicable diseases: Secondary | ICD-10-CM | POA: Diagnosis present

## 2019-03-26 DIAGNOSIS — D72829 Elevated white blood cell count, unspecified: Secondary | ICD-10-CM | POA: Diagnosis present

## 2019-03-26 DIAGNOSIS — K5730 Diverticulosis of large intestine without perforation or abscess without bleeding: Secondary | ICD-10-CM | POA: Diagnosis present

## 2019-03-26 LAB — TROPONIN I (HIGH SENSITIVITY)
Troponin I (High Sensitivity): 20 ng/L — ABNORMAL HIGH (ref <18)
Troponin I (High Sensitivity): 21 ng/L — ABNORMAL HIGH (ref <18)

## 2019-03-26 LAB — HEPATIC FUNCTION PANEL
ALT: 14 U/L (ref 0–44)
ALT: 13 U/L (ref 0–44)
AST: 52 U/L — ABNORMAL HIGH (ref 15–41)
AST: 48 U/L — ABNORMAL HIGH (ref 15–41)
Albumin: 3.9 g/dL (ref 3.5–5.0)
Albumin: 3.4 g/dL — ABNORMAL LOW (ref 3.5–5.0)
Alkaline Phosphatase: 99 U/L (ref 38–126)
Alkaline Phosphatase: 93 U/L (ref 38–126)
Bilirubin, Direct: 0.2 mg/dL (ref 0.0–0.2)
Bilirubin, Direct: 0.1 mg/dL (ref 0.0–0.2)
Indirect Bilirubin: 0.8 mg/dL (ref 0.3–0.9)
Indirect Bilirubin: 0.7 mg/dL (ref 0.3–0.9)
Total Bilirubin: 1 mg/dL (ref 0.3–1.2)
Total Bilirubin: 0.8 mg/dL (ref 0.3–1.2)
Total Protein: 7.2 g/dL (ref 6.5–8.1)
Total Protein: 6.6 g/dL (ref 6.5–8.1)

## 2019-03-26 LAB — URINALYSIS, COMPLETE (UACMP) WITH MICROSCOPIC
Bilirubin Urine: NEGATIVE
Glucose, UA: NEGATIVE mg/dL
Ketones, ur: 20 mg/dL — AB
Nitrite: NEGATIVE
Protein, ur: 30 mg/dL — AB
Specific Gravity, Urine: 1.013 (ref 1.005–1.030)
Waxy Casts, UA: 1
pH: 6 (ref 5.0–8.0)

## 2019-03-26 LAB — RESPIRATORY PANEL BY RT PCR (FLU A&B, COVID)
Influenza A by PCR: NEGATIVE
Influenza B by PCR: NEGATIVE
SARS Coronavirus 2 by RT PCR: NEGATIVE

## 2019-03-26 LAB — RENAL FUNCTION PANEL
Albumin: 3.7 g/dL (ref 3.5–5.0)
Anion gap: 20 — ABNORMAL HIGH (ref 5–15)
BUN: 22 mg/dL (ref 8–23)
CO2: 18 mmol/L — ABNORMAL LOW (ref 22–32)
Calcium: 5.5 mg/dL — CL (ref 8.9–10.3)
Chloride: 103 mmol/L (ref 98–111)
Creatinine, Ser: 1.12 mg/dL — ABNORMAL HIGH (ref 0.44–1.00)
GFR calc Af Amer: 54 mL/min — ABNORMAL LOW (ref >60)
GFR calc non Af Amer: 47 mL/min — ABNORMAL LOW (ref >60)
Glucose, Bld: 105 mg/dL — ABNORMAL HIGH (ref 70–99)
Phosphorus: 4.4 mg/dL (ref 2.5–4.6)
Potassium: 3 mmol/L — ABNORMAL LOW (ref 3.5–5.1)
Sodium: 141 mmol/L (ref 135–145)

## 2019-03-26 LAB — CBC
HCT: 42.8 % (ref 36.0–46.0)
Hemoglobin: 14.7 g/dL (ref 12.0–15.0)
MCH: 31.6 pg (ref 26.0–34.0)
MCHC: 34.3 g/dL (ref 30.0–36.0)
MCV: 92 fL (ref 80.0–100.0)
Platelets: 305 10*3/uL (ref 150–400)
RBC: 4.65 MIL/uL (ref 3.87–5.11)
RDW: 13.4 % (ref 11.5–15.5)
WBC: 11.8 10*3/uL — ABNORMAL HIGH (ref 4.0–10.5)
nRBC: 0 % (ref 0.0–0.2)

## 2019-03-26 LAB — PHOSPHORUS: Phosphorus: 4.5 mg/dL (ref 2.5–4.6)

## 2019-03-26 LAB — BASIC METABOLIC PANEL
Anion gap: 17 — ABNORMAL HIGH (ref 5–15)
BUN: 23 mg/dL (ref 8–23)
CO2: 21 mmol/L — ABNORMAL LOW (ref 22–32)
Calcium: 5.6 mg/dL — CL (ref 8.9–10.3)
Chloride: 102 mmol/L (ref 98–111)
Creatinine, Ser: 1.06 mg/dL — ABNORMAL HIGH (ref 0.44–1.00)
GFR calc Af Amer: 58 mL/min — ABNORMAL LOW (ref >60)
GFR calc non Af Amer: 50 mL/min — ABNORMAL LOW (ref >60)
Glucose, Bld: 117 mg/dL — ABNORMAL HIGH (ref 70–99)
Potassium: 2.9 mmol/L — ABNORMAL LOW (ref 3.5–5.1)
Sodium: 140 mmol/L (ref 135–145)

## 2019-03-26 LAB — URINE DRUG SCREEN, QUALITATIVE (ARMC ONLY)
Amphetamines, Ur Screen: NOT DETECTED
Barbiturates, Ur Screen: NOT DETECTED
Benzodiazepine, Ur Scrn: POSITIVE — AB
Cannabinoid 50 Ng, Ur ~~LOC~~: NOT DETECTED
Cocaine Metabolite,Ur ~~LOC~~: NOT DETECTED
MDMA (Ecstasy)Ur Screen: NOT DETECTED
Methadone Scn, Ur: NOT DETECTED
Opiate, Ur Screen: NOT DETECTED
Phencyclidine (PCP) Ur S: NOT DETECTED
Tricyclic, Ur Screen: NOT DETECTED

## 2019-03-26 LAB — MAGNESIUM: Magnesium: 0.5 mg/dL — CL (ref 1.7–2.4)

## 2019-03-26 LAB — LIPASE, BLOOD: Lipase: 50 U/L (ref 11–51)

## 2019-03-26 LAB — ETHANOL: Alcohol, Ethyl (B): <10 mg/dL (ref <10)

## 2019-03-26 LAB — TSH: TSH: 2.676 u[IU]/mL (ref 0.350–4.500)

## 2019-03-26 LAB — VITAMIN D 25 HYDROXY (VIT D DEFICIENCY, FRACTURES): Vit D, 25-Hydroxy: 38.9 ng/mL (ref 30–100)

## 2019-03-26 MED ORDER — FLUOXETINE HCL 20 MG PO CAPS: 20.0000 mg | ORAL_CAPSULE | Freq: Three times a day (TID) | ORAL | Status: DC

## 2019-03-26 MED ORDER — DONEPEZIL HCL 5 MG PO TABS
5.0000 mg | ORAL_TABLET | Freq: Every day | ORAL | Status: DC
  Administered 2019-03-26 – 2019-03-27 (×2): 5 mg via ORAL
  Filled 2019-03-26 (×3): qty 1

## 2019-03-26 MED ORDER — PANTOPRAZOLE SODIUM 40 MG PO TBEC
40.0000 mg | DELAYED_RELEASE_TABLET | Freq: Every day | ORAL | Status: DC
  Administered 2019-03-26 – 2019-03-28 (×3): 40 mg via ORAL
  Filled 2019-03-26 (×3): qty 1

## 2019-03-26 MED ORDER — POTASSIUM CHLORIDE 20 MEQ/15ML (10%) PO SOLN
40.0000 meq | Freq: Once | ORAL | Status: AC
  Administered 2019-03-26: 40 meq via ORAL
  Filled 2019-03-26: qty 30

## 2019-03-26 MED ORDER — PRAVASTATIN SODIUM 20 MG PO TABS
20.0000 mg | ORAL_TABLET | Freq: Every day | ORAL | Status: DC
  Administered 2019-03-26 – 2019-03-27 (×2): 20 mg via ORAL
  Filled 2019-03-26 (×3): qty 1

## 2019-03-26 MED ORDER — ASPIRIN EC 81 MG PO TBEC
81.0000 mg | DELAYED_RELEASE_TABLET | Freq: Every day | ORAL | Status: DC
  Administered 2019-03-26 – 2019-03-28 (×3): 81 mg via ORAL
  Filled 2019-03-26 (×3): qty 1

## 2019-03-26 MED ORDER — POTASSIUM CHLORIDE CRYS ER 20 MEQ PO TBCR
40.0000 meq | EXTENDED_RELEASE_TABLET | Freq: Two times a day (BID) | ORAL | Status: AC
  Administered 2019-03-26 – 2019-03-27 (×2): 40 meq via ORAL
  Filled 2019-03-26 (×2): qty 2

## 2019-03-26 MED ORDER — ENOXAPARIN SODIUM 40 MG/0.4ML ~~LOC~~ SOLN
40.0000 mg | SUBCUTANEOUS | Status: DC
  Administered 2019-03-26 – 2019-03-27 (×2): 40 mg via SUBCUTANEOUS
  Filled 2019-03-26 (×2): qty 0.4

## 2019-03-26 MED ORDER — LACTATED RINGERS IV BOLUS
1000.0000 mL | Freq: Once | INTRAVENOUS | Status: AC
  Administered 2019-03-26: 1000 mL via INTRAVENOUS

## 2019-03-26 MED ORDER — MAGNESIUM SULFATE 2 GM/50ML IV SOLN
2.0000 g | Freq: Once | INTRAVENOUS | Status: AC
  Administered 2019-03-26: 2 g via INTRAVENOUS
  Filled 2019-03-26: qty 50

## 2019-03-26 MED ORDER — CALCIUM GLUCONATE-NACL 1-0.675 GM/50ML-% IV SOLN
1.0000 g | Freq: Once | INTRAVENOUS | Status: AC
  Administered 2019-03-26: 1000 mg via INTRAVENOUS
  Filled 2019-03-26: qty 50

## 2019-03-26 MED ORDER — SODIUM CHLORIDE 0.9 % IV SOLN: 1.0000 g | Freq: Once | INTRAVENOUS | Status: DC

## 2019-03-26 MED ORDER — LORATADINE 10 MG PO TABS
10.0000 mg | ORAL_TABLET | Freq: Every day | ORAL | Status: DC
  Administered 2019-03-26 – 2019-03-28 (×3): 10 mg via ORAL
  Filled 2019-03-26 (×3): qty 1

## 2019-03-26 MED ORDER — IOHEXOL 300 MG/ML  SOLN
100.0000 mL | Freq: Once | INTRAMUSCULAR | Status: AC | PRN
  Administered 2019-03-26: 100 mL via INTRAVENOUS

## 2019-03-26 MED ORDER — LACTATED RINGERS IV SOLN: INTRAVENOUS | Status: DC

## 2019-03-26 MED ORDER — METOPROLOL TARTRATE 25 MG PO TABS
25.0000 mg | ORAL_TABLET | Freq: Two times a day (BID) | ORAL | Status: DC
  Administered 2019-03-26 – 2019-03-28 (×5): 25 mg via ORAL
  Filled 2019-03-26 (×5): qty 1

## 2019-03-26 MED ORDER — ALPRAZOLAM 0.5 MG PO TABS
0.5000 mg | ORAL_TABLET | Freq: Three times a day (TID) | ORAL | Status: DC
  Administered 2019-03-26 – 2019-03-28 (×5): 0.5 mg via ORAL
  Filled 2019-03-26 (×5): qty 1

## 2019-03-26 MED ORDER — IOHEXOL 9 MG/ML PO SOLN
500.0000 mL | ORAL | Status: AC
  Administered 2019-03-26 (×2): 500 mL via ORAL

## 2019-03-26 MED ORDER — SODIUM CHLORIDE 0.9% FLUSH
3.0000 mL | Freq: Two times a day (BID) | INTRAVENOUS | Status: DC
  Administered 2019-03-26 – 2019-03-28 (×4): 3 mL via INTRAVENOUS

## 2019-03-26 NOTE — ED Triage Notes (Signed)
Presents with nausea and decreased appetite  Also had some "hgot flashes and sweats"

## 2019-03-26 NOTE — H&P (Addendum)
History and Physical    Lindsay Chung [id number] DOB: 01/30/1941 DOA: 03/26/2019  PCP: Brynda Greathouse, MD   Patient coming from: Home  I have personally briefly reviewed patient's old medical records in [place]  Chief Complaint: Nausea, vomiting and increased confusion.  HPI: Lindsay Chung is a 78 y.o. female with medical history significant of anxiety, fibromyalgia, gastric reflux, hyperlipidemia and IBS presented to ED with nausea, vomiting and increased confusion. According to patient she was experiencing nausea with few vomitus for the past few days resulted in very decreased appetite.  She was hardly eating anything for the past couple of days, trying to keep her hydrated by drinking water and ginger ale.  She was recently experiencing some more muscle cramps and twitching around her lips.  She denied any recent illnesses including upper respiratory illness or sick contacts.  She was experiencing mild abdominal discomfort mostly in lower abdomen and left lower quadrant.  She was having diarrhea in hospital.  She was forgetting in between the sentences.  Per patient she is having more frequent anxiety attacks.  She was also experiencing mildly increased urinary urgency and frequency but denies any hematuria or burning micturition.  Talked with her husband on phone, she has baseline dementia being worked up by neurologist.  There was some concern about Lewy body dementia, and her neurologist is planning for further testing as initial MRI was negative.  He was concerned that she was not eating or drinking enough for the past few days due to excessive nausea and few vomiting.  No fever or chills.  He noticed some increased urinary frequency and urgency as she was using incontinent pads more.  She had an history of diarrhea for the past few month.  According to husband she was not having much diarrhea lately as she was not eating much. According to husband she is having more difficulty  with sleeping lately but he has not heard about any pain.  ED Course: On presentation she was hemodynamically stable, blood pressure of 145/85.  Labs positive for mild leukocytosis at 11.8 with multiple electrolyte derangements which include potassium of 2.9, magnesium of 0.5, calcium of 5.6, bicarb of 21 and creatinine of 1.06.  CT head without any acute abnormality.  Review of Systems: As per HPI otherwise 10 point review of systems negative.   Past Medical History:  Diagnosis Date  . Anxiety   . Fibromyalgia   . GERD (gastroesophageal reflux disease)   . Hyperlipidemia   . Irritable bowel syndrome     Past Surgical History:  Procedure Laterality Date  . BREAST BIOPSY Left    neg  . BREAST BIOPSY Left 06/16/2018   affirm stereo biopsy/ x clip/ path pending  . COLONOSCOPY W/ POLYPECTOMY    . COLONOSCOPY WITH PROPOFOL N/A 10/21/2014   Procedure: COLONOSCOPY WITH PROPOFOL;  Surgeon: Manya Silvas, MD;  Location: [facility] ENDOSCOPY;  Service: Endoscopy;  Laterality: N/A;  . ESOPHAGOGASTRODUODENOSCOPY    . GYNECOLOGIC CRYOSURGERY    . TUBAL LIGATION       reports that she has quit smoking. She has never used smokeless tobacco. She reports previous alcohol use. She reports previous drug use.  No Known Allergies  Family History  Problem Relation Age of Onset  . Breast cancer Neg Hx     Prior to Admission medications   Medication Sig Start Date End Date Taking? Authorizing Provider  acetaminophen (TYLENOL) 500 MG tablet Take 1,000 mg by mouth every 6 (six)  hours as needed for mild pain.    [provider]  ALPRAZolam Duanne Moron) 0.5 MG tablet Take 0.5 mg by mouth 2 (two) times daily. 08/10/14   [provider]  aspirin 81 MG tablet Take 81 mg by mouth daily.    [provider]  cetirizine (ZYRTEC) 10 MG tablet Take 10 mg by mouth daily.    [provider]  Cholecalciferol (VITAMIN D) 50 MCG (2000 UT) tablet Take 2,000 Units by mouth daily.      [provider]  donepezil (ARICEPT) 5 MG tablet Take 5 mg by mouth daily. 03/17/19   [provider]  FLUoxetine (PROZAC) 20 MG capsule Take 20 mg by mouth 2 (two) times daily. 05/25/14   [provider]  fluvastatin (LESCOL) 40 MG capsule Take 40 mg by mouth at bedtime.     [provider]  metoprolol tartrate (LOPRESSOR) 25 MG tablet Take 25 mg by mouth 2 (two) times daily.     [provider]  omeprazole (PRILOSEC) 20 MG capsule Take 20 mg by mouth daily. 06/12/14   [provider]    Physical Exam: Vitals:   03/26/19 0716  BP: (!) 145/85  Pulse: 84  Resp: 18  Temp: 98 F (36.7 C)  TempSrc: Oral  SpO2: 97%  Weight: 81.6 kg  Height: 5\' 3"  (1.6 m)    General: Vital signs reviewed.  Patient is well-developed and well-nourished, in no acute distress and cooperative with exam.  Head: Normocephalic and atraumatic. Eyes: EOMI, conjunctivae normal, no scleral icterus.  ENMT: Mucous membranes are dry.  Mild twitching on left angle of mouth. Neck: Supple, trachea midline, normal ROM, no JVD, masses, thyromegaly, or carotid bruit present.  Cardiovascular: RRR, S1 normal, S2 normal, no murmurs, gallops, or rubs. Pulmonary/Chest: Clear to auscultation bilaterally, no wheezes, rales, or rhonchi. Abdominal: Soft, mild tenderness on lower and left lower quadrant, non-distended, BS +,  Musculoskeletal: No joint deformities, erythema, or stiffness, ROM full and nontender. Extremities: No lower extremity edema bilaterally,  pulses symmetric and intact bilaterally. No cyanosis or clubbing. Neurological: A&O x3, Strength is normal and symmetric bilaterally, cranial nerve II-XII are grossly intact, no focal motor deficit, sensory intact to light touch bilaterally.  Skin: Warm, dry and intact. No rashes or erythema. Psychiatric: Normal mood and affect. speech and behavior is normal.   Labs on Admission: I have personally reviewed following labs and  imaging studies  CBC: Recent Labs  Lab 03/26/19 0721  WBC 11.8*  HGB 14.7  HCT 42.8  MCV 92.0  PLT 123456   Basic Metabolic Panel: Recent Labs  Lab 03/26/19 0721 03/26/19 0951  NA 140  --   K 2.9*  --   CL 102  --   CO2 21*  --   GLUCOSE 117*  --   BUN 23  --   CREATININE 1.06*  --   CALCIUM 5.6*  --   MG  --  0.5*  PHOS  --  4.5   GFR: Estimated Creatinine Clearance: 44.3 mL/min (A) (by C-G formula based on SCr of 1.06 mg/dL (H)). Liver Function Tests: Recent Labs  Lab 03/26/19 0951  AST 52*  ALT 14  ALKPHOS 99  BILITOT 1.0  PROT 7.2  ALBUMIN 3.9   Recent Labs  Lab 03/26/19 0951  LIPASE 50   No results for input(s): AMMONIA in the last 168 hours. Coagulation Profile: No results for input(s): INR, PROTIME in the last 168 hours. Cardiac Enzymes: No results for  input(s): CKTOTAL, CKMB, CKMBINDEX, TROPONINI in the last 168 hours. BNP (last 3 results) No results for input(s): PROBNP in the last 8760 hours. HbA1C: No results for input(s): HGBA1C in the last 72 hours. CBG: No results for input(s): GLUCAP in the last 168 hours. Lipid Profile: No results for input(s): CHOL, HDL, LDLCALC, TRIG, CHOLHDL, LDLDIRECT in the last 72 hours. Thyroid Function Tests: No results for input(s): TSH, T4TOTAL, FREET4, T3FREE, THYROIDAB in the last 72 hours. Anemia Panel: No results for input(s): VITAMINB12, FOLATE, FERRITIN, TIBC, IRON, RETICCTPCT in the last 72 hours. Urine analysis: No results found for: COLORURINE, APPEARANCEUR, LABSPEC, [place], GLUCOSEU, HGBUR, BILIRUBINUR, KETONESUR, PROTEINUR, UROBILINOGEN, NITRITE, LEUKOCYTESUR  Radiological Exams on Admission: CT Head Wo Contrast  Result Date: 03/26/2019 CLINICAL DATA:  Altered mental status. EXAM: CT HEAD WITHOUT CONTRAST TECHNIQUE: Contiguous axial images were obtained from the base of the skull through the vertex without intravenous contrast. COMPARISON:  CT scan dated 10/30/2013 FINDINGS: Brain: No evidence of  acute infarction, hemorrhage, hydrocephalus, extra-axial collection or mass lesion/mass effect. Slight diffuse atrophy consistent with age. Vascular: No hyperdense vessel or unexpected calcification. Skull: Normal. Negative for fracture or focal lesion. Sinuses/Orbits: Normal. Other: None IMPRESSION: No acute abnormalities. Slight diffuse atrophy consistent with age. Electronically Signed   By: Lorriane Shire M.D.   On: 03/26/2019 09:05   DG Chest Portable 1 View  Result Date: 03/26/2019 CLINICAL DATA:  Nausea and decreased appetite EXAM: PORTABLE CHEST 1 VIEW COMPARISON:  October 05, 2018 FINDINGS: Mediastinal contour and cardiac silhouette are stable. The heart size is enlarged. The lungs are clear. Bony structures are stable. IMPRESSION: No active disease. Electronically Signed   By: Abelardo Diesel M.D.   On: 03/26/2019 08:53    EKG: Independently reviewed.  Sinus rhythm with premature atrial contractions.  QTC of 521.  She has prolonged QTC on prior EKGs.  Assessment/Plan Active Problems:   * No active hospital problems. *   Hypocalcemia/electrolyte abnormalities.  Her labs are more consistent with hypomagnesemic hypocalcemia.  Phosphorous with an upper normal limit.  She had a calcium of 6.9 in July 2020 with no documentation of magnesium. -Checking labs for hypocalcemia which includes TSH, parathyroid hormone, vitamin D metabolites which include 25 hydroxy and 1, 25 dihydroxy, alkaline phosphatase level and urinary calcium and magnesium. -Repeat magnesium, potassium and calcium. -Repeat renal function testing around 2 PM. -Replete as needed. -We need to put Foley in to collect 24-hour urine for calcium and magnesium as patient is little incontinent and also having some diarrhea so pure wick will not be helpful.  Please remove Foley after 24-hour urinary collection.  Patient remained hypocalcemic, hypomagnesemic and hypokalemic after 1 round of replacement-repeating calcium gluconate, magnesium  sulfate and potassium.  Diarrhea.  Patient with history of diarrhea for the past few months.  Do not see any work-up.  She was also complaining of lower abdominal and left lower quadrant pain.  Is an history of IBS-Per chart she was having constipation issues before. -Stool studies for chronic diarrhea. -CT abdomen to rule out diverticulitis as she is having some leukocytosis-CT abdomen with diverticulosis without any sign of diverticulitis.  Encephalopathy.  Patient was not eating or drinking well for the past few days. UA has not been collected, mild leukocytosis.  CT head without any abnormality. After talking with her husband appears to be at her baseline.  She forgets sentences while talking which is being going on for few months. -Check UA as UTI can also present with some  encephalopathy in elderly.  Anxiety.  We will continue home dose of Xanax. She was on a very high dose of Prozac, and also have prolonged QTC, unable to verify as pharmacy is closed today. -We will hold Prozac for now as it has very prolonged half-life-need to be verified once pharmacies are open tomorrow.  Dementia.  Being worked up by her neurologist at [facility].  MRI done last month was without any acute abnormality.  CT done today was within normal limit. There is some concern about Lewy body dementia per husband which needs further work-up by her neurologist. -Continue Aricept.   DVT prophylaxis: Lovenox Code Status: Full code Family Communication: Talked with husband on phone. Disposition Plan: Improvement Consults called: None Admission status: Observation   Lorella Nimrod MD [facility] Pager [phone]  If 7PM-7AM, please contact night-coverage [email or web address] Password [facility]  03/26/2019, 11:18 AM   This record has been created using Systems analyst. Errors have been sought and corrected,but may not always be located. Such creation errors do not reflect on the standard of care.

## 2019-03-26 NOTE — ED Notes (Signed)
Admitting MD at bedside.

## 2019-03-26 NOTE — Progress Notes (Signed)
Patient admitted to unit from ED via stretcher. Oriented to room, call bell, and staff. Bed in lowest position. Fall safety plan reviewed. Full assessment to Epic. Skin assessment verified with Roanna Epley. Telemetry box verification with tele clerk- Box#: 10. Will continue to monitor. Unable to complete admission profile pt is confused. Alert to self only. No family at bedside.

## 2019-03-26 NOTE — ED Provider Notes (Signed)
[facility] Emergency Department Provider Note   ____________________________________________   First MD Initiated Contact with Patient 03/26/19 0820     (approximate)  I have reviewed the triage vital signs and the nursing notes.   HISTORY  Chief Complaint Nausea    HPI Lindsay Chung is a 78 y.o. female with past medical history of fibromyalgia, GERD, IBS who presents to the ED complaining of nausea and shortness of breath.  History is limited secondary to patient's confusion.  She states that she has been feeling nauseous for the past 2 to [redacted] weeks along with malaise and shortness of breath.  She denies any fevers, cough, or chest pain, and is not aware of any sick contacts.  She also denies any abdominal pain but does endorse some diarrhea.  She denies any recent changes in her medications and denies any alcohol or drug abuse.  She does appear confused and per chart has had increasing confusion over the past couple of months, recently evaluated by [facility] neurology and had MRI performed that was unremarkable.        Past Medical History:  Diagnosis Date  . Anxiety   . Fibromyalgia   . GERD (gastroesophageal reflux disease)   . Hyperlipidemia   . Irritable bowel syndrome     Patient Active Problem List   Diagnosis Date Noted  . Hypocalcemia 03/26/2019  . Hypomagnesemia   . Nausea without vomiting   . Poor appetite   . Diarrhea     Past Surgical History:  Procedure Laterality Date  . BREAST BIOPSY Left    neg  . BREAST BIOPSY Left 06/16/2018   affirm stereo biopsy/ x clip/ path pending  . COLONOSCOPY W/ POLYPECTOMY    . COLONOSCOPY WITH PROPOFOL N/A 10/21/2014   Procedure: COLONOSCOPY WITH PROPOFOL;  Surgeon: Manya Silvas, MD;  Location: [facility] ENDOSCOPY;  Service: Endoscopy;  Laterality: N/A;  . ESOPHAGOGASTRODUODENOSCOPY    . GYNECOLOGIC CRYOSURGERY    . TUBAL LIGATION      Prior to Admission medications   Medication Sig Start Date  End Date Taking? Authorizing Provider  ALPRAZolam Duanne Moron) 0.5 MG tablet Take 0.5 mg by mouth 3 (three) times daily.  08/10/14  Yes [provider]  aspirin 81 MG tablet Take 81 mg by mouth daily.   Yes [provider]  cetirizine (ZYRTEC) 10 MG tablet Take 10 mg by mouth daily.   Yes [provider]  Cholecalciferol (VITAMIN D) 50 MCG (2000 UT) tablet Take 2,000 Units by mouth daily.    Yes [provider]  donepezil (ARICEPT) 5 MG tablet Take 5 mg by mouth daily. 03/17/19  Yes [provider]  FLUoxetine (PROZAC) 20 MG capsule Take 20 mg by mouth 3 (three) times daily.  05/25/14  Yes [provider]  fluvastatin (LESCOL) 40 MG capsule Take 40 mg by mouth at bedtime.    Yes [provider]  metoprolol tartrate (LOPRESSOR) 25 MG tablet Take 25 mg by mouth 2 (two) times daily.    Yes [provider]  omeprazole (PRILOSEC) 20 MG capsule Take 20 mg by mouth daily. 06/12/14  Yes [provider]  acetaminophen (TYLENOL) 500 MG tablet Take 1,000 mg by mouth every 6 (six) hours as needed for mild pain.    [provider]    Allergies Amoxicillin  Family History  Problem Relation Age of Onset  . Breast cancer Neg Hx     Social History Social History   Tobacco  Use  . Smoking status: Former Smoker  . Smokeless tobacco: Never Used  Substance Use Topics  . Alcohol use: Not Currently  . Drug use: Not Currently    Review of Systems  Constitutional: No fever/chills Eyes: No visual changes. ENT: No sore throat. Cardiovascular: Denies chest pain. Respiratory: Denies cough, positive for shortness of breath. Gastrointestinal: No abdominal pain.  Positive for nausea, no vomiting.  Positive for diarrhea.  No constipation. Genitourinary: Negative for dysuria. Musculoskeletal: Negative for back pain. Skin: Negative for rash. Neurological: Negative for headaches, focal weakness or  numbness.  ____________________________________________   PHYSICAL EXAM:  VITAL SIGNS: ED Triage Vitals  Enc Vitals Group     BP 03/26/19 0716 (!) 145/85     Pulse Rate 03/26/19 0716 84     Resp 03/26/19 0716 18     Temp 03/26/19 0716 98 F (36.7 C)     Temp Source 03/26/19 0716 Oral     SpO2 03/26/19 0716 97 %     Weight 03/26/19 0716 180 lb (81.6 kg)     Height 03/26/19 0716 5\' 3"  (1.6 m)     Head Circumference --      Peak Flow --      Pain Score 03/26/19 0718 0     Pain Loc --      Pain Edu? --      Excl. in [place]? --     Constitutional: Alert and oriented to person and place, but not time.  Diaphoretic. Eyes: Conjunctivae are normal. Head: Atraumatic. Nose: No congestion/rhinnorhea. Mouth/Throat: Mucous membranes are moist. Neck: Normal ROM Cardiovascular: Normal rate, regular rhythm. Grossly normal heart sounds. Respiratory: Normal respiratory effort.  No retractions. Lungs CTAB. Gastrointestinal: Soft and nontender. No distention. Genitourinary: deferred Musculoskeletal: No lower extremity tenderness nor edema. Neurologic:  Normal speech and language. No gross focal neurologic deficits are appreciated. Skin:  Skin is warm, dry and intact. No rash noted. Psychiatric: Mood and affect are normal. Speech and behavior are normal.  ____________________________________________   LABS (all labs ordered are listed, but only abnormal results are displayed)  Labs Reviewed  BASIC METABOLIC PANEL - Abnormal; Notable for the following components:      Result Value   Potassium 2.9 (*)    CO2 21 (*)    Glucose, Bld 117 (*)    Creatinine, Ser 1.06 (*)    Calcium 5.6 (*)    GFR calc non Af Amer 50 (*)    GFR calc Af Amer 58 (*)    Anion gap 17 (*)    All other components within normal limits  CBC - Abnormal; Notable for the following components:   WBC 11.8 (*)    All other components within normal limits  HEPATIC FUNCTION PANEL - Abnormal; Notable for the following  components:   AST 52 (*)    All other components within normal limits  MAGNESIUM - Abnormal; Notable for the following components:   Magnesium 0.5 (*)    All other components within normal limits  HEPATIC FUNCTION PANEL - Abnormal; Notable for the following components:   Albumin 3.4 (*)    AST 48 (*)    All other components within normal limits  TROPONIN I (HIGH SENSITIVITY) - Abnormal; Notable for the following components:   Troponin I (High Sensitivity) 20 (*)    All other components within normal limits  TROPONIN I (HIGH SENSITIVITY) - Abnormal; Notable for the following components:   Troponin I (High Sensitivity) 21 (*)  All other components within normal limits  RESPIRATORY PANEL BY RT PCR (FLU A&B, COVID)  STOOL CULTURE  GIARDIA, EIA; OVA/PARASITE  ETHANOL  LIPASE, BLOOD  PHOSPHORUS  TSH  URINALYSIS, COMPLETE (UACMP) WITH MICROSCOPIC  URINE DRUG SCREEN, QUALITATIVE ([facility] ONLY)  CALCIUM, IONIZED  PTH, INTACT AND CALCIUM  CALCITRIOL (1,25 DI-OH VIT D)  VITAMIN D 25 HYDROXY (VIT D DEFICIENCY, FRACTURES)  RENAL FUNCTION PANEL  MAGNESIUM, URINE, 24 HOUR  CALCIUM, URINE, 24 HOUR   ____________________________________________  EKG  ED ECG REPORT I, Blake Divine, the attending physician, personally viewed and interpreted this ECG.   Date: 03/26/2019  EKG Time: 7:24  Rate: 82  Rhythm: normal sinus rhythm  Axis: Normal  Intervals:Prolonged QT  ST&T Change: None   PROCEDURES  Procedure(s) performed (including Critical Care):  Procedures   ____________________________________________   INITIAL IMPRESSION / ASSESSMENT AND PLAN / ED COURSE       78 year old female presents to the ED for nausea and shortness of breath that she states has been developing over the past 2 to 3 weeks.  She does appear confused on my evaluation, is oriented to person and place, but states the year is 20.  She otherwise does not seem to have any focal neurologic deficits on exam,  will check CT head.  Lab work from triage is significant for hypocalcemia, and I am concerned that this could be contributing to her altered mental status as well as prolonged QT.  Would avoid any antiemetics for now and give IV dose of calcium.  No clear etiology for her hypocalcemia as she does not take any diuretics or other medications known to cause hypocalcemia.  She complains of some shortness of breath and we will check chest x-ray, but otherwise vital signs are reassuring and no signs of sepsis.  Patient now refusing IV placement and administration of IV calcium.  I was able to speak with her husband over the phone, who confirms that she has a diagnosis of dementia although has seemed to worsen over the past week.  She has been persistently complained of nausea and has not had much to eat or drink in the past week.  This is consistent with her multiple electrolyte abnormalities.  CT head and chest x-ray are both negative for acute process.  Patient is requesting to go home at this time, but she remains confused and does not have capacity to make her medical decisions.  Husband agrees with plan for admission for electrolyte replacement.  Patient agrees to IV placement with calcium administration for now.  Labs are significant for hypomagnesemia, will replete magnesium in addition to calcium and potassium.  Case discussed with hospitalist, who accepts patient for admission.      ____________________________________________   FINAL CLINICAL IMPRESSION(S) / ED DIAGNOSES  Final diagnoses:  Hypocalcemia  Hypomagnesemia  Nausea without vomiting  Poor appetite     ED Discharge Orders    None       Note:  This document was prepared using Dragon voice recognition software and may include unintentional dictation errors.   Blake Divine, MD 03/26/19 1355

## 2019-03-27 DIAGNOSIS — M797 Fibromyalgia: Secondary | ICD-10-CM | POA: Diagnosis present

## 2019-03-27 DIAGNOSIS — Z79899 Other long term (current) drug therapy: Secondary | ICD-10-CM | POA: Diagnosis not present

## 2019-03-27 DIAGNOSIS — D72829 Elevated white blood cell count, unspecified: Secondary | ICD-10-CM | POA: Diagnosis present

## 2019-03-27 DIAGNOSIS — E785 Hyperlipidemia, unspecified: Secondary | ICD-10-CM | POA: Diagnosis present

## 2019-03-27 DIAGNOSIS — F0390 Unspecified dementia without behavioral disturbance: Secondary | ICD-10-CM | POA: Diagnosis present

## 2019-03-27 DIAGNOSIS — R197 Diarrhea, unspecified: Secondary | ICD-10-CM | POA: Diagnosis present

## 2019-03-27 DIAGNOSIS — K5730 Diverticulosis of large intestine without perforation or abscess without bleeding: Secondary | ICD-10-CM | POA: Diagnosis present

## 2019-03-27 DIAGNOSIS — G9349 Other encephalopathy: Secondary | ICD-10-CM | POA: Diagnosis present

## 2019-03-27 DIAGNOSIS — F419 Anxiety disorder, unspecified: Secondary | ICD-10-CM | POA: Diagnosis present

## 2019-03-27 DIAGNOSIS — Z7982 Long term (current) use of aspirin: Secondary | ICD-10-CM | POA: Diagnosis not present

## 2019-03-27 DIAGNOSIS — Z20828 Contact with and (suspected) exposure to other viral communicable diseases: Secondary | ICD-10-CM | POA: Diagnosis present

## 2019-03-27 DIAGNOSIS — F411 Generalized anxiety disorder: Secondary | ICD-10-CM | POA: Diagnosis present

## 2019-03-27 DIAGNOSIS — E876 Hypokalemia: Secondary | ICD-10-CM

## 2019-03-27 DIAGNOSIS — K589 Irritable bowel syndrome without diarrhea: Secondary | ICD-10-CM | POA: Diagnosis present

## 2019-03-27 DIAGNOSIS — K219 Gastro-esophageal reflux disease without esophagitis: Secondary | ICD-10-CM | POA: Diagnosis present

## 2019-03-27 DIAGNOSIS — Z881 Allergy status to other antibiotic agents status: Secondary | ICD-10-CM | POA: Diagnosis not present

## 2019-03-27 DIAGNOSIS — Z87891 Personal history of nicotine dependence: Secondary | ICD-10-CM | POA: Diagnosis not present

## 2019-03-27 DIAGNOSIS — R630 Anorexia: Secondary | ICD-10-CM | POA: Diagnosis not present

## 2019-03-27 LAB — COMPREHENSIVE METABOLIC PANEL
ALT: 11 U/L (ref 0–44)
AST: 36 U/L (ref 15–41)
Albumin: 3.1 g/dL — ABNORMAL LOW (ref 3.5–5.0)
Alkaline Phosphatase: 90 U/L (ref 38–126)
Anion gap: 11 (ref 5–15)
BUN: 15 mg/dL (ref 8–23)
CO2: 24 mmol/L (ref 22–32)
Calcium: 6.3 mg/dL — CL (ref 8.9–10.3)
Chloride: 107 mmol/L (ref 98–111)
Creatinine, Ser: 0.76 mg/dL (ref 0.44–1.00)
GFR calc Af Amer: >60 mL/min (ref >60)
GFR calc non Af Amer: >60 mL/min (ref >60)
Glucose, Bld: 93 mg/dL (ref 70–99)
Potassium: 3.2 mmol/L — ABNORMAL LOW (ref 3.5–5.1)
Sodium: 142 mmol/L (ref 135–145)
Total Bilirubin: 0.8 mg/dL (ref 0.3–1.2)
Total Protein: 5.9 g/dL — ABNORMAL LOW (ref 6.5–8.1)

## 2019-03-27 LAB — CBC
HCT: 35.9 % — ABNORMAL LOW (ref 36.0–46.0)
Hemoglobin: 12.3 g/dL (ref 12.0–15.0)
MCH: 31.9 pg (ref 26.0–34.0)
MCHC: 34.3 g/dL (ref 30.0–36.0)
MCV: 93.2 fL (ref 80.0–100.0)
Platelets: 216 10*3/uL (ref 150–400)
RBC: 3.85 MIL/uL — ABNORMAL LOW (ref 3.87–5.11)
RDW: 13.5 % (ref 11.5–15.5)
WBC: 10.1 10*3/uL (ref 4.0–10.5)
nRBC: 0 % (ref 0.0–0.2)

## 2019-03-27 LAB — GLUCOSE, CAPILLARY: Glucose-Capillary: 90 mg/dL (ref 70–99)

## 2019-03-27 LAB — PTH, INTACT AND CALCIUM
Calcium, Total (PTH): 5.5 mg/dL — CL (ref 8.7–10.3)
PTH: 26 pg/mL (ref 15–65)

## 2019-03-27 LAB — MAGNESIUM: Magnesium: 1.8 mg/dL (ref 1.7–2.4)

## 2019-03-27 LAB — PHOSPHORUS: Phosphorus: 3.7 mg/dL (ref 2.5–4.6)

## 2019-03-27 MED ORDER — CALCIUM GLUCONATE-NACL 1-0.675 GM/50ML-% IV SOLN
1.0000 g | Freq: Once | INTRAVENOUS | Status: AC
  Administered 2019-03-27: 1000 mg via INTRAVENOUS
  Filled 2019-03-27: qty 50

## 2019-03-27 MED ORDER — ACETAMINOPHEN 325 MG PO TABS
650.0000 mg | ORAL_TABLET | Freq: Four times a day (QID) | ORAL | Status: DC | PRN
  Administered 2019-03-27 (×2): 650 mg via ORAL
  Filled 2019-03-27 (×2): qty 2

## 2019-03-27 MED ORDER — POTASSIUM CHLORIDE CRYS ER 20 MEQ PO TBCR
40.0000 meq | EXTENDED_RELEASE_TABLET | Freq: Once | ORAL | Status: AC
  Administered 2019-03-27: 40 meq via ORAL
  Filled 2019-03-27: qty 2

## 2019-03-27 NOTE — Progress Notes (Signed)
Notified Dr. Arbutus Ped and clarify if we still need to continue patient's 24 hour urine collections that will end at 2130 tonight. Since patient is possibly discharging today, per MD to continue collection and will keep patient overnight.

## 2019-03-27 NOTE — Progress Notes (Addendum)
Charge RN was asking if we can move this patient to a different unit to open up a telemetry bed. Notify Dr. Arbutus Ped if this is ok and per DO as long as patient remains of telemetry monitor she is ok with this. Place order to move patient on med-surg with telemetry monitor.  Update: patient is refusing to move and would rather leave AMA if we move her.

## 2019-03-27 NOTE — Progress Notes (Signed)
Updated patient's husband jack about the plan of care.

## 2019-03-27 NOTE — Progress Notes (Signed)
5 beats NSVT noted on telemetry; On call APP notified; CMP ordered to check electrolytes; pt also has Mg and CBC ordered.

## 2019-03-27 NOTE — Progress Notes (Signed)
Lab notified of add on labs phosphorus and ionized calcium. Phlebotomist will come draw ionized calcium due to need for a different tube.

## 2019-03-27 NOTE — Progress Notes (Signed)
PROGRESS NOTE    Lindsay Chung  [id number] DOB: 1941-02-09 DOA: 03/26/2019  PCP: Brynda Greathouse, MD    LOS - 0   Brief Narrative:  78 y.o. female with medical history of anxiety, fibromyalgia, gastric reflux, hyperlipidemia and IBS presented to ED with nausea, vomiting, very poor appetite, mild abdominal discomfort and increased confusion.  She also reported muscle cramping and facial twitching around her mouth.  No recent illness or sick contacts.  Regarding confusion, husband reported to admitting physician that patient has baseline mild dementia, following with neurology for evaluation which so far has included an MRI brain that was negative.  In the ED, vitals stable, labs showed mild leukocytosis and severe electrolyte derangements (K 2.9, Mg 0.5, Ca 5.6).  CT head was negative for acute findings.  Admitted to hospitalist service for further evaluation and management.  Electrolytes have been aggressively repleted.   Additional work up thus far has revealed normal intact PTH, TSH and vitamin D.  24-hour urine collection in process (until 9:30 pm tonight).  Suspect hypomagnesemic hypocalcemia.  Subjective 12/26: Patient seen this AM and reports feeling better, feel generally weak.  Reports diarrhea resolved.  No fever, chills, SOB, chest pain or other acute complaints.  No acute events reported.  Assessment & Plan:   Principal Problem:   Hypocalcemia Active Problems:   Hypomagnesemia   Hypokalemia   Nausea without vomiting   Poor appetite   Diarrhea   Anxiety   Dementia without behavioral disturbance (HCC)  Hypocalcemia  Hypomagnesemia Hypokalemia All improving.  Initial labs consistent with hypomagnesemic hypocalcemia.  Phosphorous within normal.  Chart review showed calcium of 6.9 in July 2020, but no documentation of magnesium. TSH, PTH, vitamin D levels within normal.   --24 hour urine collection in progress for urine calcium and magnesium --monitor electrolytes  closely --replete as needed --Foley in place for 24 hour urine collection, may d/c Foley once collection finished. --telemetry monitoring  Diarrhea.  Past few months with history of IBS.  Also reported left lower quadrant pain.  CT abdomen/pelvis obtained on admission showed diverticulosis of descending and sigmoid colon with wall thickening of the sigmoid colon, though no pericolic inflammatory changes are identified; this likely represents muscular hypertrophy as a result of prior inflammation/episodes of diverticulitis and no definite acute inflammatory changes to suggest acute diverticulitis or colitis are identified. --Stool studies for chronic diarrhea - pending  Encephalopathy - resolved.  Possibly secondary to electrolyte abnormalities above.  Per husband, appears to be at her baseline.  She forgets sentences while talking which is being going on for few months.  No obvious source of infection - UA negative for UTI, CT abdomen as above, no respiratory symptoms.  Anxiety.  Chronic, stable --continue home Xanax --resume Prozac at reduced dose 20 mg BID  Home dose Prozac is 20 mg TID which was held on admission due to such a high dose, unable to verify with her pharmacy, not open.  Resumed to prevent withdrawal or decompensation.  Dementia.  Being worked up by her neurologist at [facility].  MRI done last month was without any acute abnormality.  CT done today was within normal limit. There is some concern about Lewy body dementia per husband which needs further work-up by her neurologist. -Continue Aricept.    DVT prophylaxis: Lovenox   Code Status: Full Code  Family Communication: none at bedside  Disposition Plan:  Likely discharge tomorrow, pending electrolytes stable.  PT recommends HH PT with 24 hour assistance, rolling walker.  Inpatient Status: The appropriate patient status for this patient is INPATIENT. Inpatient status is judged to be reasonable and necessary in order to  complete evaluation and management of patient's severe electrolyte abnormalities to ensure stability and determine the cause.  It would be premature and unsafe to discharge patient given the severity of electrolyte derangements present on admission.  She will require at least another 24 hours of monitoring electrolytes and completing 24 hour urine collection to evaluate etiology of this.  Given the severity of her lab abnormalities, patient is at risk of rapid deterioration, cardiac arrhythmias and mortality outside the hospital and warrants continued hospital care as outlined above.   "           The patient's presenting symptoms include muscle spasms, facial twitching, diarrhea.. "           The initial radiographic and laboratory data are worrisome because of magnesium 0.5, calcium 5.5, potassium 2.9. "           The chronic co-morbidities include anxiety, fibromyalgia, gastric reflux, hyperlipidemia and IBS.     * I certify that at the point of admission it is my clinical judgment that the patient will require inpatient hospital care spanning beyond 2 midnights from the point of admission due to high intensity of service, high risk for further deterioration and high frequency of surveillance required.*    Consultants:   None  Procedures:   None  Antimicrobials:   None    Objective: Vitals:   03/27/19 0318 03/27/19 0736 03/27/19 1055 03/27/19 1627  BP: 118/86 (!) 123/59 (!) 156/85 135/70  Pulse: 64 (!) 59 77 72  Resp: 16   18  Temp: 97.6 F (36.4 C) (!) 97.5 F (36.4 C)  98.4 F (36.9 C)  TempSrc: Oral Oral  Oral  SpO2: 95% 97% 98% 97%  Weight:      Height:        Intake/Output Summary (Last 24 hours) at 03/27/2019 1646 Last data filed at 03/27/2019 1516 Gross per 24 hour  Intake 2238.7 ml  Output 100 ml  Net 2138.7 ml   Filed Weights   03/26/19 0716  Weight: 81.6 kg    Examination:  General exam: awake, alert, no acute distress, obese HEENT: moist mucus  membranes, hearing grossly normal, negative Chvostek sign Respiratory system: clear to auscultation bilaterally, no wheezes, rales or rhonchi, normal respiratory effort. Cardiovascular system: normal S1/S2, RRR, no JVD, murmurs, rubs, gallops, no pedal edema.   Gastrointestinal system: soft, non-tender, non-distended abdomen, normal bowel sounds. Central nervous system: alert and oriented x3. no gross focal neurologic deficits, normal speech Extremities: moves all, no edema, normal tone, no carpopedal spasms during blood pressure measurement Skin: dry, intact, normal temperature Psychiatry: normal mood, congruent affect, judgement and insight appear normal    Data Reviewed: I have personally reviewed following labs and imaging studies  CBC: Recent Labs  Lab 03/26/19 0721 03/27/19 0550  WBC 11.8* 10.1  HGB 14.7 12.3  HCT 42.8 35.9*  MCV 92.0 93.2  PLT 305 [id number]   Basic Metabolic Panel: Recent Labs  Lab 03/26/19 0721 03/26/19 0951 03/26/19 1132 03/26/19 1400 03/27/19 0550 03/27/19 0611  NA 140  --   --  141 142  --   K 2.9*  --   --  3.0* 3.2*  --   CL 102  --   --  103 107  --   CO2 21*  --   --  18* 24  --  GLUCOSE 117*  --   --  105* 93  --   BUN 23  --   --  22 15  --   CREATININE 1.06*  --   --  1.12* 0.76  --   CALCIUM 5.6*  --  5.5* 5.5* 6.3*  --   MG  --  0.5*  --   --  1.8  --   PHOS  --  4.5  --  4.4  --  3.7   GFR: Estimated Creatinine Clearance: 58.6 mL/min (by C-G formula based on SCr of 0.76 mg/dL). Liver Function Tests: Recent Labs  Lab 03/26/19 0951 03/26/19 1132 03/26/19 1400 03/27/19 0550  AST 52* 48*  --  36  ALT 14 13  --  11  ALKPHOS 99 93  --  90  BILITOT 1.0 0.8  --  0.8  PROT 7.2 6.6  --  5.9*  ALBUMIN 3.9 3.4* 3.7 3.1*   Recent Labs  Lab 03/26/19 0951  LIPASE 50   No results for input(s): AMMONIA in the last 168 hours. Coagulation Profile: No results for input(s): INR, PROTIME in the last 168 hours. Cardiac Enzymes: No results  for input(s): CKTOTAL, CKMB, CKMBINDEX, TROPONINI in the last 168 hours. BNP (last 3 results) No results for input(s): PROBNP in the last 8760 hours. HbA1C: No results for input(s): HGBA1C in the last 72 hours. CBG: Recent Labs  Lab 03/27/19 0735  GLUCAP 90   Lipid Profile: No results for input(s): CHOL, HDL, LDLCALC, TRIG, CHOLHDL, LDLDIRECT in the last 72 hours. Thyroid Function Tests: Recent Labs    03/26/19 0951  TSH 2.676   Anemia Panel: No results for input(s): VITAMINB12, FOLATE, FERRITIN, TIBC, IRON, RETICCTPCT in the last 72 hours. Sepsis Labs: No results for input(s): PROCALCITON, LATICACIDVEN in the last 168 hours.  Recent Results (from the past 240 hour(s))  Respiratory Panel by RT PCR (Flu A&B, Covid) - Nasopharyngeal Swab     Status: None   Collection Time: 03/26/19  9:52 AM   Specimen: Nasopharyngeal Swab  Result Value Ref Range Status   SARS Coronavirus 2 by RT PCR NEGATIVE NEGATIVE Final    Comment: (NOTE) SARS-CoV-2 target nucleic acids are NOT DETECTED. The SARS-CoV-2 RNA is generally detectable in upper respiratoy specimens during the acute phase of infection. The lowest concentration of SARS-CoV-2 viral copies this assay can detect is 131 copies/mL. A negative result does not preclude SARS-Cov-2 infection and should not be used as the sole basis for treatment or other patient management decisions. A negative result may occur with  improper specimen collection/handling, submission of specimen other than nasopharyngeal swab, presence of viral mutation(s) within the areas targeted by this assay, and inadequate number of viral copies (<131 copies/mL). A negative result must be combined with clinical observations, patient history, and epidemiological information. The expected result is Negative. Fact Sheet for Patients:  PinkCheek.be Fact Sheet for Healthcare Providers:  GravelBags.it This test is  not yet ap proved or cleared by the [place] FDA and  has been authorized for detection and/or diagnosis of SARS-CoV-2 by FDA under an Emergency Use Authorization (EUA). This EUA will remain  in effect (meaning this test can be used) for the duration of the COVID-19 declaration under Section 564(b)(1) of the Act, 21 U.S.C. section 360bbb-3(b)(1), unless the authorization is terminated or revoked sooner.    Influenza A by PCR NEGATIVE NEGATIVE Final   Influenza B by PCR NEGATIVE NEGATIVE Final    Comment: (NOTE) The Xpert  Xpress SARS-CoV-2/FLU/RSV assay is intended as an aid in  the diagnosis of influenza from Nasopharyngeal swab specimens and  should not be used as a sole basis for treatment. Nasal washings and  aspirates are unacceptable for Xpert Xpress SARS-CoV-2/FLU/RSV  testing. Fact Sheet for Patients: PinkCheek.be Fact Sheet for Healthcare Providers: GravelBags.it This test is not yet approved or cleared by the [place] FDA and  has been authorized for detection and/or diagnosis of SARS-CoV-2 by  FDA under an Emergency Use Authorization (EUA). This EUA will remain  in effect (meaning this test can be used) for the duration of the  Covid-19 declaration under Section 564(b)(1) of the Act, 21  U.S.C. section 360bbb-3(b)(1), unless the authorization is  terminated or revoked. Performed at [facility], [address], [place], [place] [address]          Radiology Studies: CT Head Wo Contrast  Result Date: 03/26/2019 CLINICAL DATA:  Altered mental status. EXAM: CT HEAD WITHOUT CONTRAST TECHNIQUE: Contiguous axial images were obtained from the base of the skull through the vertex without intravenous contrast. COMPARISON:  CT scan dated 10/30/2013 FINDINGS: Brain: No evidence of acute infarction, hemorrhage, hydrocephalus, extra-axial collection or mass lesion/mass effect. Slight diffuse atrophy  consistent with age. Vascular: No hyperdense vessel or unexpected calcification. Skull: Normal. Negative for fracture or focal lesion. Sinuses/Orbits: Normal. Other: None IMPRESSION: No acute abnormalities. Slight diffuse atrophy consistent with age. Electronically Signed   By: Lorriane Shire M.D.   On: 03/26/2019 09:05   CT ABDOMEN PELVIS W CONTRAST  Result Date: 03/26/2019 CLINICAL DATA:  LEFT lower quadrant pain, nausea, and vomiting question diverticulitis; past history of fibromyalgia, GERD, irritable bowel syndrome, former smoker EXAM: CT ABDOMEN AND PELVIS WITH CONTRAST TECHNIQUE: Multidetector CT imaging of the abdomen and pelvis was performed using the standard protocol following bolus administration of intravenous contrast. Sagittal and coronal MPR images reconstructed from axial data set. CONTRAST:  126mL OMNIPAQUE IOHEXOL 300 MG/ML SOLN IV. Dilute oral contrast. COMPARISON:  None FINDINGS: Lower chest: Dependent bibasilar atelectasis Hepatobiliary: Multiple hepatic cysts. Gallbladder liver otherwise normal appearance Pancreas: Normal appearance Spleen: Normal appearance. Question small splenule anterior to spleen Adrenals/Urinary Tract: 6 mm nonobstructing calculus mid LEFT kidney. Adrenal glands, kidneys, ureters, and bladder otherwise normal appearance Stomach/Bowel: Normal appendix. Stomach incompletely distended, no gross abnormality seen. Small bowel loops unremarkable. Scattered diverticulosis of descending and sigmoid colon. Wall thickening of sigmoid colon throughout its length though no pericolic inflammatory changes are identified. This likely represents muscular hypertrophy is result of prior inflammation, with no acute inflammatory changes seen to suggest acute diverticulitis. Colitis is considered unlikely. Remainder of colon unremarkable. Vascular/Lymphatic: Atherosclerotic calcifications aorta and iliac arteries without aneurysm. No adenopathy. Reproductive: Atrophic uterus and  ovaries Other: No free air or free fluid. Tiny umbilical hernia containing fat. Musculoskeletal: No acute osseous findings. IMPRESSION: Diverticulosis of descending and sigmoid colon with wall thickening of the sigmoid colon, though no pericolic inflammatory changes are identified; this likely represents muscular hypertrophy as a result of prior inflammation/episodes of diverticulitis and no definite acute inflammatory changes to suggest acute diverticulitis or colitis are identified. Nonobstructing 6 mm LEFT renal calculus. Multiple hepatic cysts. Tiny umbilical hernia containing fat. Aortic Atherosclerosis (ICD10-I70.0). Electronically Signed   By: Lavonia Dana M.D.   On: 03/26/2019 15:43   DG Chest Portable 1 View  Result Date: 03/26/2019 CLINICAL DATA:  Nausea and decreased appetite EXAM: PORTABLE CHEST 1 VIEW COMPARISON:  October 05, 2018 FINDINGS: Mediastinal contour and cardiac silhouette  are stable. The heart size is enlarged. The lungs are clear. Bony structures are stable. IMPRESSION: No active disease. Electronically Signed   By: Abelardo Diesel M.D.   On: 03/26/2019 08:53        Scheduled Meds: . ALPRAZolam  0.5 mg Oral TID  . aspirin EC  81 mg Oral Daily  . donepezil  5 mg Oral QHS  . enoxaparin (LOVENOX) injection  40 mg Subcutaneous Q24H  . loratadine  10 mg Oral Daily  . metoprolol tartrate  25 mg Oral BID  . pantoprazole  40 mg Oral Daily  . potassium chloride  40 mEq Oral Once  . pravastatin  20 mg Oral QHS  . sodium chloride flush  3 mL Intravenous Q12H   Continuous Infusions: . lactated ringers 125 mL/hr at 03/27/19 1236     LOS: 0 days    Time spent: 40-45 minutes    Ezekiel Slocumb, DO [facility]   If 7PM-7AM, please contact night-coverage [email or web address] Password [facility] 03/27/2019, 4:46 PM

## 2019-03-27 NOTE — Progress Notes (Signed)
Critical calcium of 5.5 reported off chemistry panel.  Ionized calcium and phosphorous level ordered

## 2019-03-27 NOTE — Evaluation (Signed)
Physical Therapy Evaluation Patient Details Name: Lindsay Chung MRN: [id number] DOB: [birth year] Today's Date: 03/27/2019   History of Present Illness  Pt is a 78 year old F admitted with hypocalcemia following c/o increasing weakness.  PMH includes fibromyalgia, anxiety and IBS.  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  She was A&O to self and situation but did appear to become confused during conversation, with some inconsistency in social hx and pain report.  Pt experienced no difficulty with bed mobility but did require assistance with STS and ambulation (30 ft), with better stability once the RW was introduced.  Pt able to perform some static and dynamic balance testing but required UE assistance with all activity.  She presented with good volitional strength during MMT.  Pt will continue to benefit from skilled PT with focus on strength, tolerance to activity and safe functional mobility.      Follow Up Recommendations Home health PT;Supervision for mobility/OOB;Supervision/Assistance - 24 hour    Equipment Recommendations  Rolling walker with 5" wheels    Recommendations for Other Services       Precautions / Restrictions Precautions Precautions: Fall Restrictions Weight Bearing Restrictions: No      Mobility  Bed Mobility Overal bed mobility: Modified Independent             General bed mobility comments: Increased time.  Transfers Overall transfer level: Needs assistance Equipment used: Rolling walker (2 wheeled) Transfers: Sit to/from Stand Sit to Stand: Min guard         General transfer comment: Able to STS without UE assist but very weak and unsteady.  Took a few steps away from bed and then returned to sit down with uncontrolled descent.  Pt much more steady with RW on second attempt.  Ambulation/Gait Ambulation/Gait assistance: Min  guard;Min assist Gait Distance (Feet): 30 Feet Assistive device: Rolling walker (2 wheeled)     Gait velocity interpretation: <1.8 ft/sec, indicate of risk for recurrent falls General Gait Details: Very unsteady without RW.  Able to navigate room with RW and correct LOB's.  Pt did experience one posterior LOB which PT assisted in correcting, before use of RW.  Pt repeated that a RW causes her to fall but appeared to accept that she was more steady with RW after PT discussed.  Stairs            Wheelchair Mobility    Modified Rankin (Stroke Patients Only)       Balance Overall balance assessment: Needs assistance Sitting-balance support: Feet supported Sitting balance-Leahy Scale: Good     Standing balance support: Bilateral upper extremity supported Standing balance-Leahy Scale: Poor Standing balance comment: Relies on RW at this time.  Unsteady and impulsive.  Requires UE assist to perform overhead activity such as opening a cabinet.  Not safe to bend to lift object at this time.                 Standardized Balance Assessment Standardized Balance Assessment : TUG: Timed Up and Go Test     Timed Up and Go Test TUG: Normal TUG Normal TUG (seconds): 17(Requires RW for stability with turns.)     Pertinent Vitals/Pain Pain Assessment: No/denies pain(Does report fibromyalgia.)    Home Living Family/patient expects to be discharged to:: Private residence Living Arrangements: Spouse/significant other Available Help at Discharge: Family;Available 24 hours/day Type  of Home: Apartment Home Access: Level entry     Home Layout: One level Home Equipment: [place] - 2 wheels;Cane - single point Additional Comments: Pt states that her RW is older and causes her to fall because it is difficulty to use.    Prior Function Level of Independence: Independent         Comments: Husband drives her to go shopping, friend rides motorized cart and pt holds to back of it so  she can walk.     Hand Dominance        Extremity/Trunk Assessment   Upper Extremity Assessment Upper Extremity Assessment: Overall WFL for tasks assessed(Grip, elbow flex/ext, shoulder flex: 4/5 bilat.)    Lower Extremity Assessment Lower Extremity Assessment: Overall WFL for tasks assessed(Ankle DF/PF, knee flex/ex: 4+/5; hip flexion: 4-/5 bilat.  No N/T reported.)    Cervical / Trunk Assessment Cervical / Trunk Assessment: Kyphotic  Communication   Communication: No difficulties  Cognition Arousal/Alertness: Awake/alert Behavior During Therapy: WFL for tasks assessed/performed;Anxious Overall Cognitive Status: Within Functional Limits for tasks assessed                                 General Comments: Pt generally impulsive and with difficulty with ST memory during conversation. Gave varying answers during social hx and perseverated on fibromyaliga, stating that she always has pain.  Then reported 0/10 pain.      General Comments      Exercises Other Exercises Other Exercises: RW education, adjusted for pt's height x4 min Other Exercises: Education: HH PT benefit: 2 min Other Exercises: Education: pt's husband, regarding use of RW and HH PT x 66min   Assessment/Plan    PT Assessment Patient needs continued PT services  PT Problem List Decreased strength;Decreased activity tolerance;Decreased balance;Decreased knowledge of use of DME;Decreased mobility;Decreased safety awareness       PT Treatment Interventions DME instruction;Therapeutic activities;Gait training;Therapeutic exercise;Patient/family education;Balance training;Functional mobility training    PT Goals (Current goals can be found in the Care Plan section)  Acute Rehab PT Goals Patient Stated Goal: To return to being able to go shopping and help around the house some. PT Goal Formulation: With patient Time For Goal Achievement: 04/10/19 Potential to Achieve Goals: Good    Frequency      Barriers to discharge        Co-evaluation               AM-PAC PT "6 Clicks" Mobility  Outcome Measure Help needed turning from your back to your side while in a flat bed without using bedrails?: None Help needed moving from lying on your back to sitting on the side of a flat bed without using bedrails?: A Little Help needed moving to and from a bed to a chair (including a wheelchair)?: A Little Help needed standing up from a chair using your arms (e.g., wheelchair or bedside chair)?: A Little Help needed to walk in hospital room?: A Lot Help needed climbing 3-5 steps with a railing? : A Lot 6 Click Score: 17    End of Session Equipment Utilized During Treatment: Gait belt Activity Tolerance: Patient tolerated treatment well;Patient limited by fatigue Patient left: in chair;with call bell/phone within reach;with bed alarm set;with family/visitor present Nurse Communication: Mobility status PT Visit Diagnosis: Unsteadiness on feet (R26.81);Difficulty in walking, not elsewhere classified (R26.2);History of falling (Z91.81);Muscle weakness (generalized) (M62.81)    Time: [id number] PT Time Calculation (  min) (ACUTE ONLY): 34 min   Charges:   PT Evaluation $PT Eval Low Complexity: 1 Low PT Treatments $Therapeutic Activity: 8-22 mins       Roxanne Gates, PT, DPT   Roxanne Gates 03/27/2019, 11:36 AM

## 2019-03-28 LAB — BASIC METABOLIC PANEL
Anion gap: 9 (ref 5–15)
BUN: 11 mg/dL (ref 8–23)
CO2: 22 mmol/L (ref 22–32)
Calcium: 7.9 mg/dL — ABNORMAL LOW (ref 8.9–10.3)
Chloride: 108 mmol/L (ref 98–111)
Creatinine, Ser: 0.8 mg/dL (ref 0.44–1.00)
GFR calc Af Amer: >60 mL/min (ref >60)
GFR calc non Af Amer: >60 mL/min (ref >60)
Glucose, Bld: 128 mg/dL — ABNORMAL HIGH (ref 70–99)
Potassium: 4.6 mmol/L (ref 3.5–5.1)
Sodium: 139 mmol/L (ref 135–145)

## 2019-03-28 LAB — COMPREHENSIVE METABOLIC PANEL
ALT: 11 U/L (ref 0–44)
AST: 29 U/L (ref 15–41)
Albumin: 3 g/dL — ABNORMAL LOW (ref 3.5–5.0)
Alkaline Phosphatase: 82 U/L (ref 38–126)
Anion gap: 11 (ref 5–15)
BUN: 12 mg/dL (ref 8–23)
CO2: 19 mmol/L — ABNORMAL LOW (ref 22–32)
Calcium: 7.2 mg/dL — ABNORMAL LOW (ref 8.9–10.3)
Chloride: 111 mmol/L (ref 98–111)
Creatinine, Ser: 0.71 mg/dL (ref 0.44–1.00)
GFR calc Af Amer: >60 mL/min (ref >60)
GFR calc non Af Amer: >60 mL/min (ref >60)
Glucose, Bld: 89 mg/dL (ref 70–99)
Potassium: 4.2 mmol/L (ref 3.5–5.1)
Sodium: 141 mmol/L (ref 135–145)
Total Bilirubin: 0.8 mg/dL (ref 0.3–1.2)
Total Protein: 5.8 g/dL — ABNORMAL LOW (ref 6.5–8.1)

## 2019-03-28 LAB — GLUCOSE, CAPILLARY: Glucose-Capillary: 78 mg/dL (ref 70–99)

## 2019-03-28 LAB — CALCIUM, IONIZED
Calcium, Ionized, Serum: <3 mg/dL — ABNORMAL LOW (ref 4.5–5.6)
Calcium, Ionized, Serum: 3.4 mg/dL — ABNORMAL LOW (ref 4.5–5.6)

## 2019-03-28 LAB — MAGNESIUM
Magnesium: 1.5 mg/dL — ABNORMAL LOW (ref 1.7–2.4)
Magnesium: 1.9 mg/dL (ref 1.7–2.4)

## 2019-03-28 LAB — URINE CULTURE: Culture: NO GROWTH

## 2019-03-28 MED ORDER — MAGNESIUM SULFATE 2 GM/50ML IV SOLN
2.0000 g | Freq: Once | INTRAVENOUS | Status: AC
  Administered 2019-03-28: 2 g via INTRAVENOUS
  Filled 2019-03-28: qty 50

## 2019-03-28 MED ORDER — CALCIUM CARBONATE-VITAMIN D 500-200 MG-UNIT PO TABS: 1.0000 | ORAL_TABLET | Freq: Two times a day (BID) | ORAL | 0 refills | Status: AC

## 2019-03-28 MED ORDER — MAGNESIUM GLUCONATE 30 MG PO TABS: 15.0000 mg | ORAL_TABLET | Freq: Two times a day (BID) | ORAL | 0 refills | Status: AC

## 2019-03-28 MED ORDER — SODIUM CHLORIDE 0.9 % IV SOLN
500.0000 mg | Freq: Once | INTRAVENOUS | Status: DC
  Filled 2019-03-28: qty 5

## 2019-03-28 MED ORDER — FLUOXETINE HCL 20 MG PO CAPS: 20.0000 mg | ORAL_CAPSULE | Freq: Two times a day (BID) | ORAL | 0 refills | Status: AC

## 2019-03-28 MED ORDER — CALCIUM GLUCONATE-NACL 1-0.675 GM/50ML-% IV SOLN
1.0000 g | Freq: Once | INTRAVENOUS | Status: AC
  Administered 2019-03-28: 1000 mg via INTRAVENOUS
  Filled 2019-03-28: qty 50

## 2019-03-28 NOTE — Progress Notes (Signed)
Pt discharged home via private car at 1600. Husband at bedside. Pt left with BSC and walker, purse, cell phone, and charger. AVS reviewed with the pt and her husband and all questions were answered. Pt wheeled downstairs by NT.

## 2019-03-28 NOTE — Plan of Care (Signed)
  Problem: Education: Goal: Knowledge of General Education information will improve Description: Including pain rating scale, medication(s)/side effects and non-pharmacologic comfort measures Outcome: Not Progressing  Pt with dementia Problem: Coping: Goal: Level of anxiety will decrease Outcome: Not Progressing  Pt with dementia

## 2019-03-28 NOTE — Discharge Summary (Signed)
Physician Discharge Summary  Patient ID: Lindsay Chung MRN: [id number] DOB/AGE: [birth year] 78 y.o.  Admit date: 03/26/2019 Discharge date: 03/28/2019  Admission Diagnoses: Hypocalcemia  Discharge Diagnoses:  Principal Problem:   Hypocalcemia Active Problems:   Hypomagnesemia   Nausea without vomiting   Poor appetite   Diarrhea   Hypokalemia   Anxiety   Dementia without behavioral disturbance ([place])   Discharged Condition: fair  Hospital Course:  Brief Narrative:  78 y.o.femalewith medical history ofanxiety, fibromyalgia, gastric reflux, hyperlipidemiaand IBS presented to ED with nausea, vomiting, very poor appetite, mild abdominal discomfort and increased confusion.  She also reported muscle cramping and facial twitching around her mouth.  No recent illness or sick contacts.  Regarding confusion, husband reported to admitting physician that patient has baseline mild dementia, following with neurology for evaluation which so far has included an MRI brain that was negative.  In the ED, vitals stable, labs showed mild leukocytosis and severe electrolyte derangements (K 2.9, Mg 0.5, Ca 5.6).  CT head was negative for acute findings.  Admitted to hospitalist service for further evaluation and management.  Electrolytes have been aggressively repleted.   Additional work up thus far has revealed normal intact PTH, TSH and vitamin D.  24-hour urine collection in process (until 9:30 pm tonight).  Suspect hypomagnesemic hypocalcemia.  Hypocalcemia --> Close to WNL Hypomagnesemia --> Close to WNL Hypokalemia -- Resolved  Initial labs consistent with hypomagnesemichypocalcemia. Phosphorous within normal.  Chart review showed calcium of 6.9 in July 2020, but no documentation of magnesium. TSH, PTH, vitamin D levels within normal.   --24 hour urine collection in progress for urine calcium and magnesium --> IP  -- Adequate repletion  --telemetry monitoring --> No events recorded  - Advised  to f/u PCP in 1 week and may repeat labs and f/u on Urine Ca and Mg level  - Oral supplementation Rx given   Diarrhea: Resolved  - Past few months with history of IBS.  - CT abdomen/pelvis obtained on admission showed diverticulosis of descending and sigmoid colon with wall thickening of the sigmoid colon, though no pericolic inflammatory changes are identified; this likely represents muscular hypertrophy as a result of prior inflammation/episodes of diverticulitis and no definite acute inflammatory changes to suggest acute diverticulitis or colitis are identified. --Stool studies for chronic diarrhea - pending  Encephalopathy - resolved.Possibly secondary to electrolyte abnormalities above.  Per husband, appears to be at her baseline. - No obvious source of infection - UA negative for UTI, CT abdomen as above, no respiratory symptoms.  Anxiety.Chronic, stable --continue home Xanax --resume Prozac at reduced dose 20 mg BID  Home dose Prozac is 20 mg TID which was held on admission due to such a high dose, unable to verify with her pharmacy, not open.  Resumed to prevent withdrawal or decompensation.  Dementia.Being worked up by her neurologist at [facility].MRI done last month was without any acute abnormality. CT done today was within normal limit. There is some concern about Lewy body dementia per husband which needs further work-up by her neurologist. -Continue Aricept.  Consults: None  Significant Diagnostic Studies: Radiology and Blood work   Treatments: As per hospital course and D/C medlist    Discharge Exam: Blood pressure (!) 153/70, pulse 81, temperature (!) 97.5 F (36.4 C), temperature source Oral, resp. rate 18, height 5\' 3"  (1.6 m), weight 81.6 kg, SpO2 100 %.  General exam: awake, alert, no acute distress, obese HEENT: moist mucus membranes, hearing grossly normal, negative [place] sign  Respiratory system: clear to auscultation bilaterally, no wheezes,  rales or rhonchi, normal respiratory effort. Cardiovascular system: normal S1/S2, RRR, no JVD, murmurs, rubs, gallops, no pedal edema.   Gastrointestinal system: soft, non-tender, non-distended abdomen, normal bowel sounds. Central nervous system: alert and oriented x3. no gross focal neurologic deficits, normal speech Extremities: moves all, no edema, normal tone, no carpopedal spasms during blood pressure measurement Skin: dry, intact, normal temperature Psychiatry: normal mood, congruent affect, judgement and insight appear normal   Disposition: Discharge disposition: 06-Home-Health Care Svc      Stable to D/C home with [facility] PT and PCP and Neurologist F/U. Pt voided and had BM. Tolerated diet well.  Warning S/S explained to Pt when she must seek immediate medical attention. Husband and Pt expressed understanding.   Discharge Instructions    Call MD for:  difficulty breathing, headache or visual disturbances   Complete by: As directed    Call MD for:  extreme fatigue   Complete by: As directed    Diet - low sodium heart healthy   Complete by: As directed    Walk with assistance   Complete by: As directed      Allergies as of 03/28/2019      Reactions   Amoxicillin Other (See Comments)   Patient states that she can take this medication.       Medication List    TAKE these medications   acetaminophen 500 MG tablet Commonly known as: TYLENOL Take 1,000 mg by mouth every 6 (six) hours as needed for mild pain.   ALPRAZolam 0.5 MG tablet Commonly known as: XANAX Take 0.5 mg by mouth 3 (three) times daily.   aspirin 81 MG tablet Take 81 mg by mouth daily.   calcium-vitamin D 500-200 MG-UNIT tablet Commonly known as: OSCAL WITH D Take 1 tablet by mouth 2 (two) times daily.   cetirizine 10 MG tablet Commonly known as: ZYRTEC Take 10 mg by mouth daily.   donepezil 5 MG tablet Commonly known as: ARICEPT Take 5 mg by mouth daily.   FLUoxetine 20 MG capsule Commonly  known as: PROZAC Take 1 capsule (20 mg total) by mouth 2 (two) times daily. What changed: when to take this   fluvastatin 40 MG capsule Commonly known as: LESCOL Take 40 mg by mouth at bedtime.   magnesium gluconate 30 MG tablet Commonly known as: MAGONATE Take 0.5 tablets (15 mg total) by mouth 2 (two) times daily for 7 days.   metoprolol tartrate 25 MG tablet Commonly known as: LOPRESSOR Take 25 mg by mouth 2 (two) times daily.   omeprazole 20 MG capsule Commonly known as: PRILOSEC Take 20 mg by mouth daily.   Vitamin D 50 MCG (2000 UT) tablet Take 2,000 Units by mouth daily.            Durable Medical Equipment  (From admission, onward)         Start     Ordered   03/28/19 1046  For home use only DME Walker  Once    Comments: Rolling Walker  Question:  Patient needs a walker to treat with the following condition  Answer:  Lower extremity weakness   03/28/19 1046   03/28/19 1046  For home use only DME 3 n 1  Once    Comments: Needs Rolling Walker   03/28/19 1046           Signed: Thornell Mule 03/28/2019, 1:31 PM

## 2019-03-29 LAB — MAGNESIUM, URINE, 24 HOUR
Magnesium, urine: 3.8 mg/dL
Magnesium,Urine 24hr: 22.8 mg/24 hr (ref 12.0–293.0)
Total Volume: 600

## 2019-03-29 LAB — CALCIUM, URINE, 24 HOUR
Calcium, 24 hour urine: 6 mg/24 hr — ABNORMAL LOW (ref 26–354)
Calcium, Ur: 1 mg/dL
Total Volume: 600

## 2019-03-29 LAB — CALCITRIOL (1,25 DI-OH VIT D): Vit D, 1,25-Dihydroxy: 81 pg/mL — ABNORMAL HIGH (ref 19.9–79.3)

## 2019-04-01 LAB — STOOL CULTURE: E coli, Shiga toxin Assay: NEGATIVE

## 2019-04-01 LAB — STOOL CULTURE REFLEX - RSASHR

## 2019-04-01 LAB — GIARDIA, EIA; OVA/PARASITE: Giardia Ag, Stl: NEGATIVE

## 2019-04-01 LAB — STOOL CULTURE REFLEX - CMPCXR

## 2019-04-01 LAB — O&P RESULT

## 2019-09-13 ENCOUNTER — Encounter: Payer: Self-pay | Admitting: Emergency Medicine

## 2019-09-13 ENCOUNTER — Other Ambulatory Visit: Payer: Self-pay

## 2019-09-13 ENCOUNTER — Emergency Department
Admission: EM | Admit: 2019-09-13 | Discharge: 2019-09-13 | Disposition: A | Discharge status: Home / Self Care | Payer: Medicare Other | Attending: Emergency Medicine | Admitting: Emergency Medicine

## 2019-09-13 DIAGNOSIS — Z79899 Other long term (current) drug therapy: Secondary | ICD-10-CM | POA: Insufficient documentation

## 2019-09-13 DIAGNOSIS — Z87891 Personal history of nicotine dependence: Secondary | ICD-10-CM | POA: Insufficient documentation

## 2019-09-13 DIAGNOSIS — R531 Weakness: Secondary | ICD-10-CM

## 2019-09-13 DIAGNOSIS — Z7982 Long term (current) use of aspirin: Secondary | ICD-10-CM | POA: Diagnosis not present

## 2019-09-13 DIAGNOSIS — E860 Dehydration: Secondary | ICD-10-CM | POA: Insufficient documentation

## 2019-09-13 DIAGNOSIS — F0390 Unspecified dementia without behavioral disturbance: Secondary | ICD-10-CM | POA: Diagnosis not present

## 2019-09-13 LAB — CBC
HCT: 36.8 % (ref 36.0–46.0)
Hemoglobin: 12.5 g/dL (ref 12.0–15.0)
MCH: 29.7 pg (ref 26.0–34.0)
MCHC: 34 g/dL (ref 30.0–36.0)
MCV: 87.4 fL (ref 80.0–100.0)
Platelets: 227 10*3/uL (ref 150–400)
RBC: 4.21 MIL/uL (ref 3.87–5.11)
RDW: 13.3 % (ref 11.5–15.5)
WBC: 7.3 10*3/uL (ref 4.0–10.5)
nRBC: 0 % (ref 0.0–0.2)

## 2019-09-13 LAB — URINALYSIS, COMPLETE (UACMP) WITH MICROSCOPIC
Bilirubin Urine: NEGATIVE
Glucose, UA: NEGATIVE mg/dL
Ketones, ur: 5 mg/dL — AB
Nitrite: NEGATIVE
Protein, ur: NEGATIVE mg/dL
Specific Gravity, Urine: 1.015 (ref 1.005–1.030)
pH: 5 (ref 5.0–8.0)

## 2019-09-13 LAB — BASIC METABOLIC PANEL
Anion gap: 8 (ref 5–15)
BUN: 15 mg/dL (ref 8–23)
CO2: 27 mmol/L (ref 22–32)
Calcium: 8.9 mg/dL (ref 8.9–10.3)
Chloride: 105 mmol/L (ref 98–111)
Creatinine, Ser: 1.1 mg/dL — ABNORMAL HIGH (ref 0.44–1.00)
GFR calc Af Amer: 56 mL/min — ABNORMAL LOW (ref >60)
GFR calc non Af Amer: 48 mL/min — ABNORMAL LOW (ref >60)
Glucose, Bld: 97 mg/dL (ref 70–99)
Potassium: 4.1 mmol/L (ref 3.5–5.1)
Sodium: 140 mmol/L (ref 135–145)

## 2019-09-13 MED ORDER — SODIUM CHLORIDE 0.9% FLUSH: 3.0000 mL | Freq: Once | INTRAVENOUS | Status: DC

## 2019-09-13 MED ORDER — SODIUM CHLORIDE 0.9 % IV BOLUS
500.0000 mL | Freq: Once | INTRAVENOUS | Status: AC
  Administered 2019-09-13: 500 mL via INTRAVENOUS

## 2019-09-13 NOTE — ED Notes (Signed)
Unhooked pt from monitor to use restroom. Pt able to ambulate w/ one assist.

## 2019-09-13 NOTE — ED Triage Notes (Signed)
Pt from home via pov. She reports that she was seen in December for memory problems, and that has continued. She reports anxiety, sleeping issues, nightmares. Pt states her husband brought her here today because she "had such a bad night." She reports increased stumbling and falling. Pt alert & oriented to self and place. NAD Noted; denies pain.

## 2019-09-13 NOTE — ED Notes (Signed)
Pt presents to the ED w/ husband. Pt's husband stated pt had falls x 2 yesterday at home. Pt denies hitting head, LOC, injury or pain. Pt stated falls result of losing balance.

## 2019-09-13 NOTE — ED Provider Notes (Signed)
[facility] Emergency Department Provider Note ____________________________________________   First MD Initiated Contact with Patient 09/13/19 1521     (approximate)  I have reviewed the triage vital signs and the nursing notes.  HISTORY  Chief Complaint Weakness  EM caveat: Some limitation due to history of dementia  HPI Lindsay Chung is a 79 y.o. female here for evaluation for increased weakness and a couple of falls occurring the last 2 days  Husband reports that she is had similar issues in the past when she had an issue with electrolytes.  Every once in a while she will have increased number of falls, fatigue.  Last night she did not rest well.  She continues to suffer from dementia along with anxiety for which they have psychiatry.  Recently she has had a couple of falls, none of which she got injured.  They are able to assist her up with both.  Husband reports yes to help her back and forth for fear that she will fall while going to the bathroom.  She does use a walker  Has a plan to see physical therapy on Wednesday this week  Patient denies any pain injury or discomfort.  Is hungry would like something to eat, and feels like she is ready to go home.   Past Medical History:  Diagnosis Date  . Anxiety   . Fibromyalgia   . GERD (gastroesophageal reflux disease)   . Hyperlipidemia   . Irritable bowel syndrome     Patient Active Problem List   Diagnosis Date Noted  . Hypokalemia 03/27/2019  . Anxiety 03/27/2019  . Dementia without behavioral disturbance ([place]) 03/27/2019  . Hypocalcemia 03/26/2019  . Hypomagnesemia   . Nausea without vomiting   . Poor appetite   . Diarrhea     Past Surgical History:  Procedure Laterality Date  . BREAST BIOPSY Left    neg  . BREAST BIOPSY Left 06/16/2018   affirm stereo biopsy/ x clip/ path pending  . COLONOSCOPY W/ POLYPECTOMY    . COLONOSCOPY WITH PROPOFOL N/A 10/21/2014   Procedure: COLONOSCOPY  WITH PROPOFOL;  Surgeon: Manya Silvas, MD;  Location[facility] ENDOSCOPY;  Service: Endoscopy;  Laterality: N/A;  . ESOPHAGOGASTRODUODENOSCOPY    . GYNECOLOGIC CRYOSURGERY    . TUBAL LIGATION      Prior to Admission medications   Medication Sig Start Date End Date Taking? Authorizing Provider  acetaminophen (TYLENOL) 500 MG tablet Take 1,000 mg by mouth every 6 (six) hours as needed for mild pain.    [provider]  ALPRAZolam Duanne Moron) 0.5 MG tablet Take 0.5 mg by mouth 3 (three) times daily.  08/10/14   [provider]  aspirin 81 MG tablet Take 81 mg by mouth daily.    [provider]  calcium-vitamin D (OSCAL WITH D) 500-200 MG-UNIT tablet Take 1 tablet by mouth 2 (two) times daily. 03/28/19   Thornell Mule, MD  cetirizine (ZYRTEC) 10 MG tablet Take 10 mg by mouth daily.    [provider]  Cholecalciferol (VITAMIN D) 50 MCG (2000 UT) tablet Take 2,000 Units by mouth daily.     [provider]  donepezil (ARICEPT) 5 MG tablet Take 5 mg by mouth daily. 03/17/19   [provider]  FLUoxetine (PROZAC) 20 MG capsule Take 1 capsule (20 mg total) by mouth 2 (two) times daily. 03/28/19   Thornell Mule, MD  fluvastatin (LESCOL) 40 MG capsule Take 40 mg by mouth at bedtime.  [provider]  metoprolol tartrate (LOPRESSOR) 25 MG tablet Take 25 mg by mouth 2 (two) times daily.     [provider]  omeprazole (PRILOSEC) 20 MG capsule Take 20 mg by mouth daily. 06/12/14   [provider]    Allergies Amoxicillin  Family History  Problem Relation Age of Onset  . Breast cancer Neg Hx     Social History Social History   Tobacco Use  . Smoking status: Former Research scientist (life sciences)  . Smokeless tobacco: Never Used  Vaping Use  . Vaping Use: Never used  Substance Use Topics  . Alcohol use: Not Currently  . Drug use: Not Currently    Review of Systems Constitutional: No fever/chills or recent illness Eyes: No visual  changes. ENT: No sore throat. Cardiovascular: Denies chest pain. Respiratory: Denies shortness of breath. Gastrointestinal: No abdominal pain.   Genitourinary: Negative for dysuria. Musculoskeletal: Negative for back pain.  No hip pain. Skin: Negative for rash. Neurological: Negative for headaches, areas of focal weakness or numbness.    ____________________________________________   PHYSICAL EXAM:  VITAL SIGNS: ED Triage Vitals [09/13/19 1259]  Enc Vitals Group     BP (!) 113/96     Pulse Rate 66     Resp 20     Temp 97.9 F (36.6 C)     Temp Source Oral     SpO2 97 %     Weight 179 lb 14.3 oz (81.6 kg)     Height _0  (1.6 m)     Head Circumference      Peak Flow      Pain Score 0     Pain Loc      Pain Edu?      Excl. in [place]?     Constitutional: Alert and oriented to self and husband not to year. Well appearing and in no acute distress. Eyes: Conjunctivae are normal. Head: Atraumatic. Nose: No congestion/rhinnorhea. Mouth/Throat: Mucous membranes are dry. Neck: No stridor.  Cardiovascular: Normal rate, regular rhythm. Grossly normal heart sounds.  Good peripheral circulation. Respiratory: Normal respiratory effort.  No retractions. Lungs CTAB. Gastrointestinal: Soft and nontender. No distention. Musculoskeletal: No lower extremity tenderness nor edema.  Patient able to stand independently, stands without difficulty.  Moves all extremities well.  She is able to be assisted to stand without difficulty or imbalance noted Neurologic:  Normal speech and language. No gross focal neurologic deficits are appreciated.  Skin:  Skin is warm, dry and intact. No rash noted. Psychiatric: Mood and affect are normal. Speech and behavior are normal.  ____________________________________________   LABS (all labs ordered are listed, but only abnormal results are displayed)  Labs Reviewed  BASIC METABOLIC PANEL - Abnormal; Notable for the following components:      Result  Value   Creatinine, Ser 1.10 (*)    GFR calc non Af Amer 48 (*)    GFR calc Af Amer 56 (*)    All other components within normal limits  URINALYSIS, COMPLETE (UACMP) WITH MICROSCOPIC - Abnormal; Notable for the following components:   Color, Urine YELLOW (*)    APPearance CLEAR (*)    Hgb urine dipstick SMALL (*)    Ketones, ur 5 (*)    Leukocytes,Ua TRACE (*)    Bacteria, UA RARE (*)    All other components within normal limits  URINE CULTURE  CBC  CBG MONITORING, ED   ____________________________________________  EKG  I reviewed interpreted at 1310 Heart rate 70 QRS 75 QTc  460 Normal sinus rhythm, occasional PVC.  T wave flattening noted precordial leads as well as some inversion in V3. Compared with previous EKG inversion in V3 is now evident.  Of note, the patient has no cardiac or respiratory complaint.  No chest pain or trouble breathing.  No symptoms of ACS ____________________________________________  RADIOLOGY  No results found.  ____________________________________________   PROCEDURES  Procedure(s) performed: None  Procedures  Critical Care performed: No  ____________________________________________   INITIAL IMPRESSION / ASSESSMENT AND PLAN / ED COURSE  Pertinent labs & imaging results that were available during my care of the patient were reviewed by me and considered in my medical decision making (see chart for details).   Patient is for evaluation of increasing weakness over the last couple of days, insidious in nature.  No associated systemic symptoms.  She is asymptomatic at this time.  Able to stand independently, no injuries.  No cardiac pulmonary neurologic symptoms.  Lab work reassuring except for mildly elevated GFR, and she does report that she eats but does not drink much for fluids.  Suspect possibly due to dehydration, also check urinalysis.  Have ordered case management to also assist to see if additional home health care needs can be  met for this patient, does have plan to see physical therapy this week and following a PCP    Clinical Course as of Sep 13 1742  Mon Sep 13, 2019  1742 Patient resting comfortably their husband, she reports she does feel well at this time.  Husband and patient understanding agreeable with plan to discharge, discussed work to stay hydrated, follow-up with physical therapy as they are planning and then also to reach out to their primary care for closer follow-up.  Discussed fall prevention strategies, use of assistive devices to minimize risk.  Patient and husband understanding comfortable with plan for discharge   [MQ]    Clinical Course User Index [MQ] Delman Kitten, MD     ____________________________________________   FINAL CLINICAL IMPRESSION(S) / ED DIAGNOSES  Final diagnoses:  Dehydration  Generalized weakness        Note:  This document was prepared using Dragon voice recognition software and may include unintentional dictation errors       Delman Kitten, MD 09/13/19 1744

## 2019-09-13 NOTE — TOC Initial Note (Signed)
Transition of Care [facility]) - Initial/Assessment Note    Patient Details  Name: Lindsay Chung MRN: [id number] Date of Birth: [birth year]  [facility] CM/SW Contact:    Anselm Pancoast, RN Phone Number: 09/13/2019, 3:46 PM  Clinical Narrative:                 LVMM for spouse, Barnabas Lister, requesting callback to discuss patient baseline and potential discharge needs .        Patient Goals and CMS Choice        Expected Discharge Plan and Services                                                Prior Living Arrangements/Services                       Activities of Daily Living      Permission Sought/Granted                  Emotional Assessment              Admission diagnosis:  weakness, fall Patient Active Problem List   Diagnosis Date Noted  . Hypokalemia 03/27/2019  . Anxiety 03/27/2019  . Dementia without behavioral disturbance ([place]) 03/27/2019  . Hypocalcemia 03/26/2019  . Hypomagnesemia   . Nausea without vomiting   . Poor appetite   . Diarrhea    PCP:  Brynda Greathouse, MD Pharmacy:   [place], [place] - [address] [place] [address] Phone: [phone] Fax: [phone]     Social Determinants of Health (SDOH) Interventions    Readmission Risk Interventions No flowsheet data found.

## 2019-09-14 LAB — URINE CULTURE

## 2019-11-17 ENCOUNTER — Other Ambulatory Visit: Payer: Self-pay | Admitting: Internal Medicine

## 2019-11-17 DIAGNOSIS — Z1231 Encounter for screening mammogram for malignant neoplasm of breast: Secondary | ICD-10-CM

## 2020-02-14 ENCOUNTER — Encounter: Payer: Self-pay | Admitting: Ophthalmology

## 2020-02-14 ENCOUNTER — Other Ambulatory Visit: Payer: Self-pay

## 2020-02-17 ENCOUNTER — Other Ambulatory Visit
Admission: RE | Admit: 2020-02-17 | Discharge: 2020-02-17 | Disposition: A | Discharge status: Home / Self Care | Payer: Medicare Other | Source: Ambulatory Visit | Attending: Ophthalmology | Admitting: Ophthalmology

## 2020-02-17 ENCOUNTER — Other Ambulatory Visit: Payer: Self-pay

## 2020-02-17 DIAGNOSIS — Z20822 Contact with and (suspected) exposure to covid-19: Secondary | ICD-10-CM | POA: Diagnosis not present

## 2020-02-17 DIAGNOSIS — Z01812 Encounter for preprocedural laboratory examination: Secondary | ICD-10-CM | POA: Diagnosis present

## 2020-02-17 NOTE — Discharge Instructions (Signed)

## 2020-02-18 LAB — SARS CORONAVIRUS 2 (TAT 6-24 HRS): SARS Coronavirus 2: NEGATIVE

## 2020-02-21 ENCOUNTER — Encounter
Admission: RE | Disposition: A | Discharge status: Home / Self Care | Payer: Self-pay | Source: Home / Self Care | Attending: Ophthalmology

## 2020-02-21 ENCOUNTER — Ambulatory Visit: Payer: Medicare Other | Admitting: Anesthesiology

## 2020-02-21 ENCOUNTER — Ambulatory Visit
Admission: RE | Admit: 2020-02-21 | Discharge: 2020-02-21 | Disposition: A | Discharge status: Home / Self Care | Payer: Medicare Other | Attending: Ophthalmology | Admitting: Ophthalmology

## 2020-02-21 ENCOUNTER — Encounter: Payer: Self-pay | Admitting: Ophthalmology

## 2020-02-21 ENCOUNTER — Other Ambulatory Visit: Payer: Self-pay

## 2020-02-21 DIAGNOSIS — Z881 Allergy status to other antibiotic agents status: Secondary | ICD-10-CM | POA: Insufficient documentation

## 2020-02-21 DIAGNOSIS — H2512 Age-related nuclear cataract, left eye: Secondary | ICD-10-CM | POA: Diagnosis present

## 2020-02-21 DIAGNOSIS — Z7982 Long term (current) use of aspirin: Secondary | ICD-10-CM | POA: Insufficient documentation

## 2020-02-21 DIAGNOSIS — Z87891 Personal history of nicotine dependence: Secondary | ICD-10-CM | POA: Diagnosis not present

## 2020-02-21 DIAGNOSIS — Z79899 Other long term (current) drug therapy: Secondary | ICD-10-CM | POA: Insufficient documentation

## 2020-02-21 HISTORY — PX: CATARACT EXTRACTION W/PHACO: SHX586

## 2020-02-21 HISTORY — DX: Essential (primary) hypertension: I10

## 2020-02-21 HISTORY — DX: Other amnesia: R41.3

## 2020-02-21 SURGERY — PHACOEMULSIFICATION, CATARACT, WITH IOL INSERTION
Anesthesia: Monitor Anesthesia Care | Site: Eye | Laterality: Left

## 2020-02-21 MED ORDER — LACTATED RINGERS IV SOLN: INTRAVENOUS | Status: DC

## 2020-02-21 MED ORDER — ARMC OPHTHALMIC DILATING DROPS
1.0000 "application " | OPHTHALMIC | Status: DC | PRN
  Administered 2020-02-21 (×3): 1 via OPHTHALMIC

## 2020-02-21 MED ORDER — MIDAZOLAM HCL 2 MG/2ML IJ SOLN
INTRAMUSCULAR | Status: DC | PRN
  Administered 2020-02-21: .5 mg via INTRAVENOUS

## 2020-02-21 MED ORDER — MOXIFLOXACIN HCL 0.5 % OP SOLN
OPHTHALMIC | Status: DC | PRN
  Administered 2020-02-21: 0.2 mL via OPHTHALMIC

## 2020-02-21 MED ORDER — SODIUM HYALURONATE 10 MG/ML IO SOLN
INTRAOCULAR | Status: DC | PRN
  Administered 2020-02-21: 0.55 mL via INTRAOCULAR

## 2020-02-21 MED ORDER — SODIUM HYALURONATE 23 MG/ML IO SOLN
INTRAOCULAR | Status: DC | PRN
  Administered 2020-02-21: 0.6 mL via INTRAOCULAR

## 2020-02-21 MED ORDER — TETRACAINE HCL 0.5 % OP SOLN
1.0000 [drp] | OPHTHALMIC | Status: DC | PRN
  Administered 2020-02-21 (×3): 1 [drp] via OPHTHALMIC

## 2020-02-21 MED ORDER — FENTANYL CITRATE (PF) 100 MCG/2ML IJ SOLN
INTRAMUSCULAR | Status: DC | PRN
  Administered 2020-02-21: 50 ug via INTRAVENOUS

## 2020-02-21 MED ORDER — LIDOCAINE HCL (PF) 2 % IJ SOLN
INTRAOCULAR | Status: DC | PRN
  Administered 2020-02-21: 1 mL via INTRAOCULAR

## 2020-02-21 MED ORDER — EPINEPHRINE PF 1 MG/ML IJ SOLN
INTRAOCULAR | Status: DC | PRN
  Administered 2020-02-21: 73 mL via OPHTHALMIC

## 2020-02-21 SURGICAL SUPPLY — 19 items
CANNULA ANT/CHMB 27G (MISCELLANEOUS) ×2 IMPLANT
CANNULA ANT/CHMB 27GA (MISCELLANEOUS) ×6 IMPLANT
DISSECTOR HYDRO NUCLEUS 50X22 (MISCELLANEOUS) ×3 IMPLANT
GLOVE SURG LX 7.5 STRW (GLOVE) ×4
GLOVE SURG LX STRL 7.5 STRW (GLOVE) ×1 IMPLANT
GLOVE SURG SYN 8.5  E (GLOVE) ×2
GLOVE SURG SYN 8.5 E (GLOVE) ×1 IMPLANT
GLOVE SURG SYN 8.5 PF PI (GLOVE) ×1 IMPLANT
GOWN STRL REUS W/ TWL LRG LVL3 (GOWN DISPOSABLE) ×2 IMPLANT
GOWN STRL REUS W/TWL LRG LVL3 (GOWN DISPOSABLE) ×6
LENS IOL TECNIS EYHANCE 19.5 (Intraocular Lens) ×2 IMPLANT
MARKER SKIN DUAL TIP RULER LAB (MISCELLANEOUS) ×3 IMPLANT
PACK DR. KING ARMS (PACKS) ×3 IMPLANT
PACK EYE AFTER SURG (MISCELLANEOUS) ×3 IMPLANT
PACK OPTHALMIC (MISCELLANEOUS) ×3 IMPLANT
SYR 3ML LL SCALE MARK (SYRINGE) ×3 IMPLANT
SYR TB 1ML LUER SLIP (SYRINGE) ×3 IMPLANT
WATER STERILE IRR 250ML POUR (IV SOLUTION) ×3 IMPLANT
WIPE NON LINTING 3.25X3.25 (MISCELLANEOUS) ×3 IMPLANT

## 2020-02-21 NOTE — Anesthesia Procedure Notes (Signed)
Procedure Name: MAC Date/Time: 02/21/2020 10:36 AM Performed by: Silvana Newness, CRNA Pre-anesthesia Checklist: Patient identified, Emergency Drugs available, Suction available, Patient being monitored and Timeout performed Patient Re-evaluated:Patient Re-evaluated prior to induction Oxygen Delivery Method: Nasal cannula Placement Confirmation: positive ETCO2

## 2020-02-21 NOTE — Transfer of Care (Signed)
Immediate Anesthesia Transfer of Care Note  Patient: [name]  Procedure(s) Performed: CATARACT EXTRACTION PHACO AND INTRAOCULAR LENS PLACEMENT (IOC) LEFT (Left Eye)  Patient Location: PACU  Anesthesia Type: MAC  Level of Consciousness: awake, alert  and patient cooperative  Airway and Oxygen Therapy: Patient Spontanous Breathing and Patient connected to supplemental oxygen  Post-op Assessment: Post-op Vital signs reviewed, Patient's Cardiovascular Status Stable, Respiratory Function Stable, Patent Airway and No signs of Nausea or vomiting  Post-op Vital Signs: Reviewed and stable  Complications: No complications documented.

## 2020-02-21 NOTE — Anesthesia Postprocedure Evaluation (Signed)
Anesthesia Post Note  Patient: Lindsay Chung  Procedure(s) Performed: CATARACT EXTRACTION PHACO AND INTRAOCULAR LENS PLACEMENT (IOC) LEFT (Left Eye)     Patient location during evaluation: PACU Anesthesia Type: MAC Level of consciousness: awake and alert Pain management: pain level controlled Vital Signs Assessment: post-procedure vital signs reviewed and stable Respiratory status: spontaneous breathing Cardiovascular status: stable Anesthetic complications: no   No complications documented.  Gillian Scarce

## 2020-02-21 NOTE — H&P (Signed)
[place]   Primary Care Physician:  Tracie Harrier, MD Ophthalmologist: Dr. Benay Pillow  Pre-Procedure History & Physical: HPI:  Lindsay Chung is a 79 y.o. female here for cataract surgery.   Past Medical History:  Diagnosis Date  . Anxiety   . Fibromyalgia   . GERD (gastroesophageal reflux disease)   . Hyperlipidemia   . Hypertension   . Irritable bowel syndrome   . Memory difficulty     Past Surgical History:  Procedure Laterality Date  . BREAST BIOPSY Left    neg  . BREAST BIOPSY Left 06/16/2018   affirm stereo biopsy/ x clip/ path pending  . COLONOSCOPY W/ POLYPECTOMY    . COLONOSCOPY WITH PROPOFOL N/A 10/21/2014   Procedure: COLONOSCOPY WITH PROPOFOL;  Surgeon: Manya Silvas, MD;  Location: [facility] ENDOSCOPY;  Service: Endoscopy;  Laterality: N/A;  . ESOPHAGOGASTRODUODENOSCOPY    . GYNECOLOGIC CRYOSURGERY    . TUBAL LIGATION      Prior to Admission medications   Medication Sig Start Date End Date Taking? Authorizing Provider  acetaminophen (TYLENOL) 500 MG tablet Take 1,000 mg by mouth every 6 (six) hours as needed for mild pain.   Yes [provider]  ALPRAZolam Duanne Moron) 0.5 MG tablet Take 0.5 mg by mouth 3 (three) times daily.  08/10/14  Yes [provider]  aspirin 81 MG tablet Take 81 mg by mouth daily.   Yes [provider]  calcium-vitamin D (OSCAL WITH D) 500-200 MG-UNIT tablet Take 1 tablet by mouth 2 (two) times daily. 03/28/19  Yes Thornell Mule, MD  cetirizine (ZYRTEC) 10 MG tablet Take 10 mg by mouth daily.   Yes [provider]  Cholecalciferol (VITAMIN D) 50 MCG (2000 UT) tablet Take 2,000 Units by mouth daily.    Yes [provider]  clobetasol cream (TEMOVATE) 4.31 % Apply 1 application topically as needed.   Yes [provider]  donepezil (ARICEPT) 5 MG tablet Take 5 mg by mouth daily.  03/17/19  Yes [provider]  FLUoxetine (PROZAC) 20 MG capsule Take 1 capsule (20 mg  total) by mouth 2 (two) times daily. 03/28/19  Yes Thornell Mule, MD  fluvastatin (LESCOL) 40 MG capsule Take 40 mg by mouth at bedtime.    Yes [provider]  memantine (NAMENDA) 5 MG tablet Take 5 mg by mouth daily.   Yes [provider]  metoprolol tartrate (LOPRESSOR) 25 MG tablet Take 25 mg by mouth 2 (two) times daily.    Yes [provider]  Multiple Vitamin (MULTIVITAMIN) tablet Take 1 tablet by mouth daily.   Yes [provider]  omeprazole (PRILOSEC) 20 MG capsule Take 20 mg by mouth daily.  06/12/14  Yes [provider]  risperiDONE (RISPERDAL) 0.25 MG tablet Take 0.25 mg by mouth at bedtime.   Yes [provider]    Allergies as of 01/13/2020 - Review Complete 09/13/2019  Allergen Reaction Noted  . Amoxicillin Other (See Comments) 10/03/2013    Family History  Problem Relation Age of Onset  . Breast cancer Neg Hx     Social History   Socioeconomic History  . Marital status: Married    Spouse name: Not on file  . Number of children: Not on file  . Years of education: Not on file  . Highest education level: Not on file  Occupational History  . Not on file  Tobacco Use  . Smoking status: Former Research scientist (life sciences)  . Smokeless tobacco: Never Used  .  Tobacco comment: "quit years ago"  Vaping Use  . Vaping Use: Never used  Substance and Sexual Activity  . Alcohol use: Not Currently  . Drug use: Not Currently  . Sexual activity: Not Currently  Other Topics Concern  . Not on file  Social History Narrative  . Not on file   Social Determinants of Health   Financial Resource Strain:   . Difficulty of Paying Living Expenses: Not on file  Food Insecurity:   . Worried About Charity fundraiser in the Last Year: Not on file  . Ran Out of Food in the Last Year: Not on file  Transportation Needs:   . Lack of Transportation (Medical): Not on file  . Lack of Transportation (Non-Medical): Not on file  Physical Activity:   .  Days of Exercise per Week: Not on file  . Minutes of Exercise per Session: Not on file  Stress:   . Feeling of Stress : Not on file  Social Connections:   . Frequency of Communication with Friends and Family: Not on file  . Frequency of Social Gatherings with Friends and Family: Not on file  . Attends Religious Services: Not on file  . Active Member of Clubs or Organizations: Not on file  . Attends Archivist Meetings: Not on file  . Marital Status: Not on file  Intimate Partner Violence:   . Fear of Current or Ex-Partner: Not on file  . Emotionally Abused: Not on file  . Physically Abused: Not on file  . Sexually Abused: Not on file    Review of Systems: See HPI, otherwise negative ROS  Physical Exam: BP (!) 107/48   Pulse 67   Temp 98.1 F (36.7 C) (Temporal)   Ht 5\' 3"  (1.6 m)   Wt 67.6 kg   SpO2 97%   BMI 26.39 kg/m  General:   Alert,  pleasant and cooperative in NAD Head:  Normocephalic and atraumatic. Respiratory:  Normal work of breathing.   Impression/Plan: Lindsay Chung is here for cataract surgery.  Risks, benefits, limitations, and alternatives regarding cataract surgery have been reviewed with the patient.  Questions have been answered.  All parties agreeable.   Benay Pillow, MD  02/21/2020, 10:22 AM

## 2020-02-21 NOTE — Anesthesia Preprocedure Evaluation (Signed)
Anesthesia Evaluation  Patient identified by MRN, date of birth, ID band Patient awake    Reviewed: Allergy & Precautions, H&P , NPO status , Patient's Chart, lab work & pertinent test results  History of Anesthesia Complications Negative for: history of anesthetic complications  Airway Mallampati: II  TM Distance: >3 FB Neck ROM: full    Dental no notable dental hx.    Pulmonary former smoker,    Pulmonary exam normal        Cardiovascular hypertension, On Medications Normal cardiovascular exam Rhythm:regular Rate:Normal     Neuro/Psych Anxiety Dementia    GI/Hepatic Neg liver ROS, Medicated,  Endo/Other  negative endocrine ROS  Renal/GU   negative genitourinary   Musculoskeletal   Abdominal   Peds  Hematology negative hematology ROS (+)   Anesthesia Other Findings   Reproductive/Obstetrics                             Anesthesia Physical Anesthesia Plan  ASA: II  Anesthesia Plan: MAC   Post-op Pain Management:    Induction:   PONV Risk Score and Plan:   Airway Management Planned:   Additional Equipment:   Intra-op Plan:   Post-operative Plan:   Informed Consent: I have reviewed the patients History and Physical, chart, labs and discussed the procedure including the risks, benefits and alternatives for the proposed anesthesia with the patient or authorized representative who has indicated his/her understanding and acceptance.       Plan Discussed with:   Anesthesia Plan Comments:         Anesthesia Quick Evaluation

## 2020-02-21 NOTE — Op Note (Signed)
OPERATIVE NOTE  Lindsay Chung [id number] 02/21/2020   PREOPERATIVE DIAGNOSIS:  Nuclear sclerotic cataract left eye.  H25.12   POSTOPERATIVE DIAGNOSIS:    Nuclear sclerotic cataract left eye.     PROCEDURE:  Phacoemusification with posterior chamber intraocular lens placement of the left eye   LENS:   Implant Name Type Inv. Item Serial No. Manufacturer Lot No. LRB No. Used Action  LENS IOL TECNIS EYHANCE 19.5 - [id number] Intraocular Lens LENS IOL TECNIS EYHANCE 19.5 [id number] JOHNSON   Left 1 Implanted      Procedure(s) with comments: CATARACT EXTRACTION PHACO AND INTRAOCULAR LENS PLACEMENT (IOC) LEFT (Left) - 6.37 0:40.0  DIB00 +19.5   ULTRASOUND TIME: 0 minutes 40 seconds.  CDE 6.37   SURGEON:  Benay Pillow, MD, MPH   ANESTHESIA:  Topical with tetracaine drops augmented with 1% preservative-free intracameral lidocaine.  ESTIMATED BLOOD LOSS: <1 mL   COMPLICATIONS:  None.   DESCRIPTION OF PROCEDURE:  The patient was identified in the holding room and transported to the operating room and placed in the supine position under the operating microscope.  The left eye was identified as the operative eye and it was prepped and draped in the usual sterile ophthalmic fashion.   A 1.0 millimeter clear-corneal paracentesis was made at the 5:00 position. 0.5 ml of preservative-free 1% lidocaine with epinephrine was injected into the anterior chamber.  The anterior chamber was filled with Healon 5 viscoelastic.  A 2.4 millimeter keratome was used to make a near-clear corneal incision at the 2:00 position.  A curvilinear capsulorrhexis was made with a cystotome and capsulorrhexis forceps.  Balanced salt solution was used to hydrodissect and hydrodelineate the nucleus.   Phacoemulsification was then used in stop and chop fashion to remove the lens nucleus and epinucleus.  The remaining cortex was then removed using the irrigation and aspiration handpiece. Healon was then placed into the  capsular bag to distend it for lens placement.  A lens was then injected into the capsular bag.  The remaining viscoelastic was aspirated.   Wounds were hydrated with balanced salt solution.  The anterior chamber was inflated to a physiologic pressure with balanced salt solution.  Intracameral vigamox 0.1 mL undiltued was injected into the eye and a drop placed onto the ocular surface.  No wound leaks were noted.  The patient was taken to the recovery room in stable condition without complications of anesthesia or surgery  Benay Pillow 02/21/2020, 10:57 AM

## 2020-02-22 ENCOUNTER — Encounter: Payer: Self-pay | Admitting: Ophthalmology

## 2020-03-08 ENCOUNTER — Encounter: Payer: Self-pay | Admitting: Ophthalmology

## 2020-03-08 ENCOUNTER — Other Ambulatory Visit: Payer: Self-pay

## 2020-03-09 ENCOUNTER — Other Ambulatory Visit
Admission: RE | Admit: 2020-03-09 | Discharge: 2020-03-09 | Disposition: A | Discharge status: Home / Self Care | Payer: Medicare Other | Source: Ambulatory Visit | Attending: Ophthalmology | Admitting: Ophthalmology

## 2020-03-09 DIAGNOSIS — Z20822 Contact with and (suspected) exposure to covid-19: Secondary | ICD-10-CM | POA: Diagnosis not present

## 2020-03-09 DIAGNOSIS — Z01812 Encounter for preprocedural laboratory examination: Secondary | ICD-10-CM | POA: Insufficient documentation

## 2020-03-09 NOTE — Discharge Instructions (Signed)

## 2020-03-10 LAB — SARS CORONAVIRUS 2 (TAT 6-24 HRS): SARS Coronavirus 2: NEGATIVE

## 2020-03-13 ENCOUNTER — Ambulatory Visit: Payer: Medicare Other | Admitting: Anesthesiology

## 2020-03-13 ENCOUNTER — Ambulatory Visit
Admission: RE | Admit: 2020-03-13 | Discharge: 2020-03-13 | Disposition: A | Discharge status: Home / Self Care | Payer: Medicare Other | Attending: Ophthalmology | Admitting: Ophthalmology

## 2020-03-13 ENCOUNTER — Other Ambulatory Visit: Payer: Self-pay

## 2020-03-13 ENCOUNTER — Encounter: Payer: Self-pay | Admitting: Ophthalmology

## 2020-03-13 ENCOUNTER — Encounter
Admission: RE | Disposition: A | Discharge status: Home / Self Care | Payer: Self-pay | Source: Home / Self Care | Attending: Ophthalmology

## 2020-03-13 DIAGNOSIS — H2511 Age-related nuclear cataract, right eye: Secondary | ICD-10-CM | POA: Insufficient documentation

## 2020-03-13 DIAGNOSIS — Z7982 Long term (current) use of aspirin: Secondary | ICD-10-CM | POA: Insufficient documentation

## 2020-03-13 DIAGNOSIS — Z79899 Other long term (current) drug therapy: Secondary | ICD-10-CM | POA: Insufficient documentation

## 2020-03-13 DIAGNOSIS — Z880 Allergy status to penicillin: Secondary | ICD-10-CM | POA: Insufficient documentation

## 2020-03-13 DIAGNOSIS — Z87891 Personal history of nicotine dependence: Secondary | ICD-10-CM | POA: Diagnosis not present

## 2020-03-13 HISTORY — PX: CATARACT EXTRACTION W/PHACO: SHX586

## 2020-03-13 SURGERY — PHACOEMULSIFICATION, CATARACT, WITH IOL INSERTION
Anesthesia: Monitor Anesthesia Care | Site: Eye | Laterality: Right

## 2020-03-13 MED ORDER — LACTATED RINGERS IV SOLN: INTRAVENOUS | Status: DC

## 2020-03-13 MED ORDER — LIDOCAINE HCL (PF) 2 % IJ SOLN
INTRAOCULAR | Status: DC | PRN
  Administered 2020-03-13: 12:00:00 1 mL via INTRAOCULAR

## 2020-03-13 MED ORDER — TETRACAINE HCL 0.5 % OP SOLN
1.0000 [drp] | OPHTHALMIC | Status: DC | PRN
  Administered 2020-03-13 (×3): 1 [drp] via OPHTHALMIC

## 2020-03-13 MED ORDER — FENTANYL CITRATE (PF) 100 MCG/2ML IJ SOLN
INTRAMUSCULAR | Status: DC | PRN
  Administered 2020-03-13: 50 ug via INTRAVENOUS

## 2020-03-13 MED ORDER — ARMC OPHTHALMIC DILATING DROPS
1.0000 "application " | OPHTHALMIC | Status: DC | PRN
  Administered 2020-03-13 (×3): 1 via OPHTHALMIC

## 2020-03-13 MED ORDER — EPINEPHRINE PF 1 MG/ML IJ SOLN
INTRAOCULAR | Status: DC | PRN
  Administered 2020-03-13: 12:00:00 86 mL via OPHTHALMIC

## 2020-03-13 MED ORDER — SODIUM HYALURONATE 10 MG/ML IO SOLN
INTRAOCULAR | Status: DC | PRN
  Administered 2020-03-13: 0.55 mL via INTRAOCULAR

## 2020-03-13 MED ORDER — ACETAMINOPHEN 325 MG PO TABS
325.0000 mg | ORAL_TABLET | ORAL | Status: DC | PRN
Start: 2020-03-13 — End: 2020-03-13

## 2020-03-13 MED ORDER — ACETAMINOPHEN 160 MG/5ML PO SOLN: 325.0000 mg | ORAL | Status: DC | PRN

## 2020-03-13 MED ORDER — MOXIFLOXACIN HCL 0.5 % OP SOLN
OPHTHALMIC | Status: DC | PRN
  Administered 2020-03-13: 0.2 mL via OPHTHALMIC

## 2020-03-13 MED ORDER — SODIUM HYALURONATE 23 MG/ML IO SOLN
INTRAOCULAR | Status: DC | PRN
  Administered 2020-03-13: 0.6 mL via INTRAOCULAR

## 2020-03-13 MED ORDER — MIDAZOLAM HCL 2 MG/2ML IJ SOLN
INTRAMUSCULAR | Status: DC | PRN
  Administered 2020-03-13: .5 mg via INTRAVENOUS

## 2020-03-13 SURGICAL SUPPLY — 19 items
CANNULA ANT/CHMB 27G (MISCELLANEOUS) ×2 IMPLANT
CANNULA ANT/CHMB 27GA (MISCELLANEOUS) ×6 IMPLANT
DISSECTOR HYDRO NUCLEUS 50X22 (MISCELLANEOUS) ×3 IMPLANT
GLOVE SURG LX 7.5 STRW (GLOVE) ×4
GLOVE SURG LX STRL 7.5 STRW (GLOVE) ×1 IMPLANT
GLOVE SURG SYN 8.5  E (GLOVE) ×2
GLOVE SURG SYN 8.5 E (GLOVE) ×1 IMPLANT
GLOVE SURG SYN 8.5 PF PI (GLOVE) ×1 IMPLANT
GOWN STRL REUS W/ TWL LRG LVL3 (GOWN DISPOSABLE) ×2 IMPLANT
GOWN STRL REUS W/TWL LRG LVL3 (GOWN DISPOSABLE) ×6
LENS IOL TECNIS EYHANCE 19.0 (Intraocular Lens) ×2 IMPLANT
MARKER SKIN DUAL TIP RULER LAB (MISCELLANEOUS) ×3 IMPLANT
PACK DR. KING ARMS (PACKS) ×3 IMPLANT
PACK EYE AFTER SURG (MISCELLANEOUS) ×3 IMPLANT
PACK OPTHALMIC (MISCELLANEOUS) ×3 IMPLANT
SYR 3ML LL SCALE MARK (SYRINGE) ×3 IMPLANT
SYR TB 1ML LUER SLIP (SYRINGE) ×3 IMPLANT
WATER STERILE IRR 250ML POUR (IV SOLUTION) ×3 IMPLANT
WIPE NON LINTING 3.25X3.25 (MISCELLANEOUS) ×3 IMPLANT

## 2020-03-13 NOTE — Anesthesia Preprocedure Evaluation (Signed)
Anesthesia Evaluation  Patient identified by MRN, date of birth, ID band Patient awake    Reviewed: Allergy & Precautions, H&P , NPO status , Patient's Chart, lab work & pertinent test results  History of Anesthesia Complications Negative for: history of anesthetic complications  Airway Mallampati: II  TM Distance: >3 FB Neck ROM: full    Dental no notable dental hx.    Pulmonary former smoker,    Pulmonary exam normal        Cardiovascular hypertension, On Medications Normal cardiovascular exam Rhythm:regular Rate:Normal     Neuro/Psych Anxiety Dementia    GI/Hepatic Neg liver ROS, GERD  Medicated,  Endo/Other  negative endocrine ROS  Renal/GU   negative genitourinary   Musculoskeletal  (+) Fibromyalgia -  Abdominal   Peds  Hematology negative hematology ROS (+)   Anesthesia Other Findings   Reproductive/Obstetrics                             Anesthesia Physical  Anesthesia Plan  ASA: II  Anesthesia Plan: MAC   Post-op Pain Management:    Induction:   PONV Risk Score and Plan:   Airway Management Planned: Natural Airway  Additional Equipment:   Intra-op Plan:   Post-operative Plan:   Informed Consent: I have reviewed the patients History and Physical, chart, labs and discussed the procedure including the risks, benefits and alternatives for the proposed anesthesia with the patient or authorized representative who has indicated his/her understanding and acceptance.       Plan Discussed with:   Anesthesia Plan Comments:         Anesthesia Quick Evaluation

## 2020-03-13 NOTE — H&P (Signed)
[place]   Primary Care Physician:  Tracie Harrier, MD Ophthalmologist: Dr. Benay Pillow  Pre-Procedure History & Physical: HPI:  Lindsay Chung is a 79 y.o. female here for cataract surgery.   Past Medical History:  Diagnosis Date  . Anxiety   . Fibromyalgia   . GERD (gastroesophageal reflux disease)   . Hyperlipidemia   . Hypertension   . Irritable bowel syndrome   . Memory difficulty     Past Surgical History:  Procedure Laterality Date  . BREAST BIOPSY Left    neg  . BREAST BIOPSY Left 06/16/2018   affirm stereo biopsy/ x clip/ path pending  . CATARACT EXTRACTION W/PHACO Left 02/21/2020   Procedure: CATARACT EXTRACTION PHACO AND INTRAOCULAR LENS PLACEMENT ([place]) LEFT;  Surgeon: Eulogio Bear, MD;  Location: [facility];  Service: Ophthalmology;  Laterality: Left;  6.37 0:40.0  . COLONOSCOPY W/ POLYPECTOMY    . COLONOSCOPY WITH PROPOFOL N/A 10/21/2014   Procedure: COLONOSCOPY WITH PROPOFOL;  Surgeon: Manya Silvas, MD;  Location: [facility] ENDOSCOPY;  Service: Endoscopy;  Laterality: N/A;  . ESOPHAGOGASTRODUODENOSCOPY    . GYNECOLOGIC CRYOSURGERY    . TUBAL LIGATION      Prior to Admission medications   Medication Sig Start Date End Date Taking? Authorizing Provider  acetaminophen (TYLENOL) 500 MG tablet Take 1,000 mg by mouth every 6 (six) hours as needed for mild pain.   Yes [provider]  ALPRAZolam Duanne Moron) 0.5 MG tablet Take 0.5 mg by mouth 3 (three) times daily.  08/10/14  Yes [provider]  aspirin 81 MG tablet Take 81 mg by mouth daily.   Yes [provider]  calcium-vitamin D (OSCAL WITH D) 500-200 MG-UNIT tablet Take 1 tablet by mouth 2 (two) times daily. 03/28/19  Yes Thornell Mule, MD  cetirizine (ZYRTEC) 10 MG tablet Take 10 mg by mouth daily.   Yes [provider]  Cholecalciferol (VITAMIN D) 50 MCG (2000 UT) tablet Take 2,000 Units by mouth daily.    Yes [provider]  clobetasol  cream (TEMOVATE) 4.19 % Apply 1 application topically as needed.   Yes [provider]  donepezil (ARICEPT) 5 MG tablet Take 5 mg by mouth daily.  03/17/19  Yes [provider]  FLUoxetine (PROZAC) 20 MG capsule Take 1 capsule (20 mg total) by mouth 2 (two) times daily. 03/28/19  Yes Thornell Mule, MD  fluvastatin (LESCOL) 40 MG capsule Take 40 mg by mouth at bedtime.    Yes [provider]  memantine (NAMENDA) 5 MG tablet Take 5 mg by mouth daily.   Yes [provider]  metoprolol tartrate (LOPRESSOR) 25 MG tablet Take 25 mg by mouth 2 (two) times daily.    Yes [provider]  Multiple Vitamin (MULTIVITAMIN) tablet Take 1 tablet by mouth daily.   Yes [provider]  omeprazole (PRILOSEC) 20 MG capsule Take 20 mg by mouth daily.  06/12/14  Yes [provider]  risperiDONE (RISPERDAL) 0.25 MG tablet Take 0.25 mg by mouth at bedtime.   Yes [provider]    Allergies as of 02/23/2020 - Review Complete 02/21/2020  Allergen Reaction Noted  . Amoxicillin Other (See Comments) 10/03/2013    Family History  Problem Relation Age of Onset  . Breast cancer Neg Hx     Social History   Socioeconomic History  . Marital status: Married    Spouse name: Not on file  . Number of children: Not on file  .  Years of education: Not on file  . Highest education level: Not on file  Occupational History  . Not on file  Tobacco Use  . Smoking status: Former Research scientist (life sciences)  . Smokeless tobacco: Never Used  . Tobacco comment: "quit years ago"  Vaping Use  . Vaping Use: Never used  Substance and Sexual Activity  . Alcohol use: Not Currently  . Drug use: Not Currently  . Sexual activity: Not Currently  Other Topics Concern  . Not on file  Social History Narrative  . Not on file   Social Determinants of Health   Financial Resource Strain: Not on file  Food Insecurity: Not on file  Transportation Needs: Not on file  Physical  Activity: Not on file  Stress: Not on file  Social Connections: Not on file  Intimate Partner Violence: Not on file    Review of Systems: See HPI, otherwise negative ROS  Physical Exam: BP 129/64   Pulse 65   Temp (!) 97 F (36.1 C) (Temporal)   Resp 16   Ht 5\' 3"  (1.6 m)   Wt 68.4 kg   SpO2 100%   BMI 26.70 kg/m  General:   Alert,  pleasant and cooperative in NAD Head:  Normocephalic and atraumatic. Respiratory:  Normal work of breathing.  Impression/Plan: [name] is here for cataract surgery.  Risks, benefits, limitations, and alternatives regarding cataract surgery have been reviewed with the patient.  Questions have been answered.  All parties agreeable.   Benay Pillow, MD  03/13/2020, 11:06 AM

## 2020-03-13 NOTE — Anesthesia Procedure Notes (Signed)
Procedure Name: MAC Date/Time: 03/13/2020 11:23 AM Performed by: Silvana Newness, CRNA Pre-anesthesia Checklist: Patient identified, Emergency Drugs available, Suction available, Patient being monitored and Timeout performed Patient Re-evaluated:Patient Re-evaluated prior to induction Oxygen Delivery Method: Nasal cannula Placement Confirmation: positive ETCO2

## 2020-03-13 NOTE — Anesthesia Postprocedure Evaluation (Signed)
Anesthesia Post Note  Patient: Lindsay Chung  Procedure(s) Performed: CATARACT EXTRACTION PHACO AND INTRAOCULAR LENS PLACEMENT (IOC) RIGHT (Right Eye)     Patient location during evaluation: PACU Anesthesia Type: MAC Level of consciousness: awake and alert Pain management: pain level controlled Vital Signs Assessment: post-procedure vital signs reviewed and stable Respiratory status: spontaneous breathing, nonlabored ventilation, respiratory function stable and patient connected to nasal cannula oxygen Cardiovascular status: stable and blood pressure returned to baseline Postop Assessment: no apparent nausea or vomiting Anesthetic complications: no   No complications documented.  Wanda Plump Jamia Hoban

## 2020-03-13 NOTE — Transfer of Care (Signed)
Immediate Anesthesia Transfer of Care Note  Patient: Lindsay Chung  Procedure(s) Performed: CATARACT EXTRACTION PHACO AND INTRAOCULAR LENS PLACEMENT (IOC) RIGHT (Right Eye)  Patient Location: PACU  Anesthesia Type: MAC  Level of Consciousness: awake, alert  and patient cooperative  Airway and Oxygen Therapy: Patient Spontanous Breathing and Patient connected to supplemental oxygen  Post-op Assessment: Post-op Vital signs reviewed, Patient's Cardiovascular Status Stable, Respiratory Function Stable, Patent Airway and No signs of Nausea or vomiting  Post-op Vital Signs: Reviewed and stable  Complications: No complications documented.

## 2020-03-13 NOTE — Op Note (Signed)
OPERATIVE NOTE  DRAYA FELKER [id number] 03/13/2020   PREOPERATIVE DIAGNOSIS:  Nuclear sclerotic cataract right eye.  H25.11   POSTOPERATIVE DIAGNOSIS:    Nuclear sclerotic cataract right eye.     PROCEDURE:  Phacoemusification with posterior chamber intraocular lens placement of the right eye   LENS:   Implant Name Type Inv. Item Serial No. Manufacturer Lot No. LRB No. Used Action  LENS IOL TECNIS EYHANCE 19.0 - [id number] Intraocular Lens LENS IOL TECNIS EYHANCE 19.0 [id number] [id number]   Right 1 Implanted       Procedure(s) with comments: CATARACT EXTRACTION PHACO AND INTRAOCULAR LENS PLACEMENT (IOC) RIGHT (Right) - 6.72 0:42.2  DIB00 +19.0   ULTRASOUND TIME: 0 minutes 42 seconds.  CDE 6.72   SURGEON:  Benay Pillow, MD, MPH  ANESTHESIOLOGIST: Anesthesiologist: Carlos American, MD CRNA: Silvana Newness, CRNA   ANESTHESIA:  Topical with tetracaine drops augmented with 1% preservative-free intracameral lidocaine.  ESTIMATED BLOOD LOSS: less than 1 mL.   COMPLICATIONS:  None.   DESCRIPTION OF PROCEDURE:  The patient was identified in the holding room and transported to the operating room and placed in the supine position under the operating microscope.  The right eye was identified as the operative eye and it was prepped and draped in the usual sterile ophthalmic fashion.   A 1.0 millimeter clear-corneal paracentesis was made at the 10:30 position. 0.5 ml of preservative-free 1% lidocaine with epinephrine was injected into the anterior chamber.  The anterior chamber was filled with Healon 5 viscoelastic.  A 2.4 millimeter keratome was used to make a near-clear corneal incision at the 8:00 position.  A curvilinear capsulorrhexis was made with a cystotome and capsulorrhexis forceps.  Balanced salt solution was used to hydrodissect and hydrodelineate the nucleus.   Phacoemulsification was then used in stop and chop fashion to remove the lens nucleus and epinucleus.  The  remaining cortex was then removed using the irrigation and aspiration handpiece. Healon was then placed into the capsular bag to distend it for lens placement.  A lens was then injected into the capsular bag.  The remaining viscoelastic was aspirated.   Wounds were hydrated with balanced salt solution.  The anterior chamber was inflated to a physiologic pressure with balanced salt solution.   Intracameral vigamox 0.1 mL undiluted was injected into the eye and a drop placed onto the ocular surface.  No wound leaks were noted.  The patient was taken to the recovery room in stable condition without complications of anesthesia or surgery  Benay Pillow 03/13/2020, 11:43 AM

## 2020-04-15 ENCOUNTER — Other Ambulatory Visit: Payer: Medicare Other

## 2020-07-02 ENCOUNTER — Emergency Department: Payer: Medicare Other

## 2020-07-02 ENCOUNTER — Other Ambulatory Visit: Payer: Self-pay

## 2020-07-02 ENCOUNTER — Encounter: Payer: Self-pay | Admitting: Emergency Medicine

## 2020-07-02 ENCOUNTER — Emergency Department
Admission: EM | Admit: 2020-07-02 | Discharge: 2020-07-03 | Disposition: A | Discharge status: Home / Self Care | Payer: Medicare Other | Attending: Emergency Medicine | Admitting: Emergency Medicine

## 2020-07-02 DIAGNOSIS — Z046 Encounter for general psychiatric examination, requested by authority: Secondary | ICD-10-CM | POA: Diagnosis not present

## 2020-07-02 DIAGNOSIS — K7689 Other specified diseases of liver: Secondary | ICD-10-CM | POA: Insufficient documentation

## 2020-07-02 DIAGNOSIS — I10 Essential (primary) hypertension: Secondary | ICD-10-CM | POA: Diagnosis not present

## 2020-07-02 DIAGNOSIS — Z87891 Personal history of nicotine dependence: Secondary | ICD-10-CM | POA: Diagnosis not present

## 2020-07-02 DIAGNOSIS — Z20822 Contact with and (suspected) exposure to covid-19: Secondary | ICD-10-CM | POA: Insufficient documentation

## 2020-07-02 DIAGNOSIS — F0281 Dementia in other diseases classified elsewhere with behavioral disturbance: Secondary | ICD-10-CM | POA: Diagnosis not present

## 2020-07-02 DIAGNOSIS — R4182 Altered mental status, unspecified: Secondary | ICD-10-CM | POA: Diagnosis present

## 2020-07-02 DIAGNOSIS — Z7982 Long term (current) use of aspirin: Secondary | ICD-10-CM | POA: Diagnosis not present

## 2020-07-02 DIAGNOSIS — G3183 Dementia with Lewy bodies: Secondary | ICD-10-CM

## 2020-07-02 DIAGNOSIS — K5730 Diverticulosis of large intestine without perforation or abscess without bleeding: Secondary | ICD-10-CM | POA: Diagnosis not present

## 2020-07-02 DIAGNOSIS — Z79899 Other long term (current) drug therapy: Secondary | ICD-10-CM | POA: Diagnosis not present

## 2020-07-02 DIAGNOSIS — I700 Atherosclerosis of aorta: Secondary | ICD-10-CM | POA: Insufficient documentation

## 2020-07-02 DIAGNOSIS — I2510 Atherosclerotic heart disease of native coronary artery without angina pectoris: Secondary | ICD-10-CM | POA: Diagnosis not present

## 2020-07-02 HISTORY — DX: Dementia in other diseases classified elsewhere, unspecified severity, without behavioral disturbance, psychotic disturbance, mood disturbance, and anxiety: F02.80

## 2020-07-02 LAB — URINE DRUG SCREEN, QUALITATIVE (ARMC ONLY)
Amphetamines, Ur Screen: NOT DETECTED
Barbiturates, Ur Screen: NOT DETECTED
Benzodiazepine, Ur Scrn: POSITIVE — AB
Cannabinoid 50 Ng, Ur ~~LOC~~: NOT DETECTED
Cocaine Metabolite,Ur ~~LOC~~: NOT DETECTED
MDMA (Ecstasy)Ur Screen: NOT DETECTED
Methadone Scn, Ur: NOT DETECTED
Opiate, Ur Screen: NOT DETECTED
Phencyclidine (PCP) Ur S: NOT DETECTED
Tricyclic, Ur Screen: NOT DETECTED

## 2020-07-02 LAB — COMPREHENSIVE METABOLIC PANEL
ALT: 11 U/L (ref 0–44)
AST: 29 U/L (ref 15–41)
Albumin: 3.4 g/dL — ABNORMAL LOW (ref 3.5–5.0)
Alkaline Phosphatase: 87 U/L (ref 38–126)
Anion gap: 9 (ref 5–15)
BUN: 14 mg/dL (ref 8–23)
CO2: 25 mmol/L (ref 22–32)
Calcium: 8.5 mg/dL — ABNORMAL LOW (ref 8.9–10.3)
Chloride: 105 mmol/L (ref 98–111)
Creatinine, Ser: 0.93 mg/dL (ref 0.44–1.00)
GFR, Estimated: >60 mL/min (ref >60)
Glucose, Bld: 113 mg/dL — ABNORMAL HIGH (ref 70–99)
Potassium: 3.3 mmol/L — ABNORMAL LOW (ref 3.5–5.1)
Sodium: 139 mmol/L (ref 135–145)
Total Bilirubin: 0.7 mg/dL (ref 0.3–1.2)
Total Protein: 6.5 g/dL (ref 6.5–8.1)

## 2020-07-02 LAB — URINALYSIS, COMPLETE (UACMP) WITH MICROSCOPIC
Bilirubin Urine: NEGATIVE
Glucose, UA: NEGATIVE mg/dL
Hgb urine dipstick: NEGATIVE
Ketones, ur: NEGATIVE mg/dL
Nitrite: NEGATIVE
Protein, ur: NEGATIVE mg/dL
Specific Gravity, Urine: 1.012 (ref 1.005–1.030)
pH: 6 (ref 5.0–8.0)

## 2020-07-02 LAB — ETHANOL: Alcohol, Ethyl (B): <10 mg/dL (ref <10)

## 2020-07-02 LAB — CBC
HCT: 37.4 % (ref 36.0–46.0)
Hemoglobin: 12.2 g/dL (ref 12.0–15.0)
MCH: 29 pg (ref 26.0–34.0)
MCHC: 32.6 g/dL (ref 30.0–36.0)
MCV: 88.8 fL (ref 80.0–100.0)
Platelets: 185 10*3/uL (ref 150–400)
RBC: 4.21 MIL/uL (ref 3.87–5.11)
RDW: 13.1 % (ref 11.5–15.5)
WBC: 5.9 10*3/uL (ref 4.0–10.5)
nRBC: 0 % (ref 0.0–0.2)

## 2020-07-02 LAB — SALICYLATE LEVEL: Salicylate Lvl: <7 mg/dL — ABNORMAL LOW (ref 7.0–30.0)

## 2020-07-02 LAB — CBG MONITORING, ED: Glucose-Capillary: 101 mg/dL — ABNORMAL HIGH (ref 70–99)

## 2020-07-02 MED ORDER — MEMANTINE HCL 5 MG PO TABS: 5.0000 mg | ORAL_TABLET | Freq: Every day | ORAL | Status: DC

## 2020-07-02 MED ORDER — RISPERIDONE 0.25 MG PO TABS
0.2500 mg | ORAL_TABLET | Freq: Every day | ORAL | Status: DC
  Filled 2020-07-02: qty 1

## 2020-07-02 MED ORDER — RISPERIDONE 1 MG PO TABS
0.5000 mg | ORAL_TABLET | Freq: Once | ORAL | Status: AC
  Administered 2020-07-02: 0.5 mg via ORAL
  Filled 2020-07-02: qty 1

## 2020-07-02 MED ORDER — PANTOPRAZOLE SODIUM 40 MG PO TBEC: 40.0000 mg | DELAYED_RELEASE_TABLET | Freq: Every day | ORAL | Status: DC

## 2020-07-02 MED ORDER — DONEPEZIL HCL 5 MG PO TABS: 5.0000 mg | ORAL_TABLET | Freq: Every day | ORAL | Status: DC

## 2020-07-02 MED ORDER — FLUOXETINE HCL 20 MG PO CAPS: 20.0000 mg | ORAL_CAPSULE | Freq: Two times a day (BID) | ORAL | Status: DC

## 2020-07-02 MED ORDER — VITAMIN D 25 MCG (1000 UNIT) PO TABS: 2000.0000 [IU] | ORAL_TABLET | Freq: Every day | ORAL | Status: DC

## 2020-07-02 MED ORDER — LORAZEPAM 2 MG/ML IJ SOLN
0.5000 mg | Freq: Once | INTRAMUSCULAR | Status: AC
  Administered 2020-07-02: 0.5 mg via INTRAVENOUS
  Filled 2020-07-02: qty 1

## 2020-07-02 MED ORDER — METOPROLOL TARTRATE 25 MG PO TABS: 25.0000 mg | ORAL_TABLET | Freq: Two times a day (BID) | ORAL | Status: DC

## 2020-07-02 MED ORDER — ADULT MULTIVITAMIN W/MINERALS CH: 1.0000 | ORAL_TABLET | Freq: Every day | ORAL | Status: DC

## 2020-07-02 MED ORDER — ASPIRIN EC 81 MG PO TBEC: 81.0000 mg | DELAYED_RELEASE_TABLET | Freq: Every day | ORAL | Status: DC

## 2020-07-02 MED ORDER — IOHEXOL 300 MG/ML  SOLN
75.0000 mL | Freq: Once | INTRAMUSCULAR | Status: AC | PRN
  Administered 2020-07-02: 75 mL via INTRAVENOUS

## 2020-07-02 NOTE — ED Provider Notes (Signed)
Emergency Medicine psychiatric observation   Patient placed into psychiatric observation at this time  Lindsay Chung is a 80 y.o. female, seen on rounds today.  Pt initially presented to the ED for complaints of Altered Mental Status Currently, the patient is now resting comfortably, having received Ativan and Risperdal she is resting comfortably without distress with normal vital signs.  Husband reports she is appears much improved now.  Currently under IVC.   Plan  Current plan is for psychiatry consultation. Patient is under full IVC at this time.  Ongoing care and follow-up assigned to Dr. Tommi Rumps, MD 07/02/20 [phone]
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
## 2020-07-02 NOTE — ED Provider Notes (Signed)
[facility] Emergency Department Provider Note  ____________________________________________  Time seen: Approximately 12:53 PM  I have reviewed the triage vital signs and the nursing notes.   HISTORY  Chief Complaint Altered Mental Status  51 of HPI is provided by patient's husband  HPI Lindsay Chung is a 80 y.o. female who presents the emergency department with her husband for a complaint of mental status change.  History is mainly provided by the husband.  Patient denies all complaints at this time, is very upset that we do not "let me go home.."  Patient denies any pain at this time.  According to the husband, patient has Lewy bodies dementia and has had difficulties with memory loss, visual hallucinations in the past.  Patient is followed by neurology, psychiatry, and primary care for her dementia.  Patient has had a rapid progression of symptoms over the past couple weeks to couple of months.  However this morning patient had an abrupt mental status change.  According to the husband patient became very agitated, stating that she went to go home when they were already home.  Patient became somewhat aggressive, forced her way outside to the car.  Patient was placed in the car by her husband and brought to the emergency department.  Patient made multiple threats of jumping out of the car in route.  The patient's daughter met the patient here in the emergency department and when realizing that she was being brought in for evaluation the patient threatened to stab her daughter."  According to the husband this is very atypical behavior.  She has never been aggressive, never has been threatening in the past.  There is been no reported recent symptoms of fevers or chills, nasal congestion, coughing, complaints of sore throat, complaints of chest pain, complaints of headache, urinary changes.  Again patient denies all complaints and is very upset that we do not immediately  release her.        Past Medical History:  Diagnosis Date  . Anxiety   . Fibromyalgia   . GERD (gastroesophageal reflux disease)   . Hyperlipidemia   . Hypertension   . Irritable bowel syndrome   . Lewy body dementia ([place])   . Memory difficulty     Patient Active Problem List   Diagnosis Date Noted  . Hypokalemia 03/27/2019  . Anxiety 03/27/2019  . Dementia without behavioral disturbance ([place]) 03/27/2019  . Hypocalcemia 03/26/2019  . Hypomagnesemia   . Nausea without vomiting   . Poor appetite   . Diarrhea     Past Surgical History:  Procedure Laterality Date  . BREAST BIOPSY Left    neg  . BREAST BIOPSY Left 06/16/2018   affirm stereo biopsy/ x clip/ path pending  . CATARACT EXTRACTION W/PHACO Left 02/21/2020   Procedure: CATARACT EXTRACTION PHACO AND INTRAOCULAR LENS PLACEMENT ([place]) LEFT;  Surgeon: Eulogio Bear, MD;  Location: [place];  Service: Ophthalmology;  Laterality: Left;  6.37 0:40.0  . CATARACT EXTRACTION W/PHACO Right 03/13/2020   Procedure: CATARACT EXTRACTION PHACO AND INTRAOCULAR LENS PLACEMENT ([place]) RIGHT;  Surgeon: Eulogio Bear, MD;  Location: [place];  Service: Ophthalmology;  Laterality: Right;  6.72 0:42.2  . COLONOSCOPY W/ POLYPECTOMY    . COLONOSCOPY WITH PROPOFOL N/A 10/21/2014   Procedure: COLONOSCOPY WITH PROPOFOL;  Surgeon: Manya Silvas, MD;  Location: [facility] ENDOSCOPY;  Service: Endoscopy;  Laterality: N/A;  . ESOPHAGOGASTRODUODENOSCOPY    . GYNECOLOGIC CRYOSURGERY    . TUBAL LIGATION  Prior to Admission medications   Medication Sig Start Date End Date Taking? Authorizing Provider  acetaminophen (TYLENOL) 500 MG tablet Take 1,000 mg by mouth every 6 (six) hours as needed for mild pain.    [provider]  ALPRAZolam Duanne Moron) 0.5 MG tablet Take 0.5 mg by mouth 3 (three) times daily.  08/10/14   [provider]  aspirin 81 MG tablet Take 81 mg by mouth daily.    [provider]  calcium-vitamin D (OSCAL WITH D) 500-200 MG-UNIT tablet Take 1 tablet by mouth 2 (two) times daily. 03/28/19   Thornell Mule, MD  cetirizine (ZYRTEC) 10 MG tablet Take 10 mg by mouth daily.    [provider]  Cholecalciferol (VITAMIN D) 50 MCG (2000 UT) tablet Take 2,000 Units by mouth daily.     [provider]  clobetasol cream (TEMOVATE) 3.43 % Apply 1 application topically as needed.    [provider]  donepezil (ARICEPT) 5 MG tablet Take 5 mg by mouth daily.  03/17/19   [provider]  FLUoxetine (PROZAC) 20 MG capsule Take 1 capsule (20 mg total) by mouth 2 (two) times daily. 03/28/19   Thornell Mule, MD  fluvastatin (LESCOL) 40 MG capsule Take 40 mg by mouth at bedtime.     [provider]  memantine (NAMENDA) 5 MG tablet Take 5 mg by mouth daily.    [provider]  metoprolol tartrate (LOPRESSOR) 25 MG tablet Take 25 mg by mouth 2 (two) times daily.     [provider]  Multiple Vitamin (MULTIVITAMIN) tablet Take 1 tablet by mouth daily.    [provider]  omeprazole (PRILOSEC) 20 MG capsule Take 20 mg by mouth daily.  06/12/14   [provider]  risperiDONE (RISPERDAL) 0.25 MG tablet Take 0.25 mg by mouth at bedtime.    [provider]    Allergies Amoxicillin  Family History  Problem Relation Age of Onset  . Breast cancer Neg Hx     Social History Social History   Tobacco Use  . Smoking status: Former Research scientist (life sciences)  . Smokeless tobacco: Never Used  . Tobacco comment: "quit years ago"  Vaping Use  . Vaping Use: Never used  Substance Use Topics  . Alcohol use: Not Currently  . Drug use: Not Currently     Review of Systems   Patient denies all complaints currently.  Review of systems is somewhat limited as majority is provided by the patient's husband  Constitutional: No reported fever/chills Eyes: No discharge ENT: No reported nasal congestion or sore  throat Cardiovascular: no reported chest pain. Respiratory: no reported cough. No reported SOB. Gastrointestinal: No abdominal pain.  No nausea, no vomiting.  No diarrhea.  No constipation. Genitourinary: No reported urinary changes Musculoskeletal: Negative for musculoskeletal pain.  No reported trauma or falls Skin: Negative for rash, abrasions, lacerations, ecchymosis. Neurological: No reported, focal weakness or numbness. Psychological: Subtle mental status change with aggression, verbal threats  10 System ROS otherwise negative.  ____________________________________________   PHYSICAL EXAM:  VITAL SIGNS: ED Triage Vitals  Enc Vitals Group     BP 07/02/20 1223 130/85     Pulse Rate 07/02/20 1223 74     Resp 07/02/20 1223 18     Temp 07/02/20 1223 97.6 F (36.4 C)     Temp Source 07/02/20 1223 Oral     SpO2 07/02/20 1223 95 %     Weight 07/02/20 1223 151 lb (68.5 kg)  Height 07/02/20 1223 _0  (1.6 m)     Head Circumference --      Peak Flow --      Pain Score 07/02/20 1230 0     Pain Loc --      Pain Edu? --      Excl. in [place]? --      Constitutional: Alert and oriented. Well appearing and in no acute distress. Eyes: Conjunctivae are normal. PERRL. EOMI. Head: Atraumatic. ENT:      Ears: EACs and TMs unremarkable bilaterally.      Nose: No congestion/rhinnorhea.      Mouth/Throat: Mucous membranes are moist.  Oropharynx is nonerythematous and nonedematous.  Uvula is midline. Neck: No stridor.   Hematological/Lymphatic/Immunilogical: No cervical lymphadenopathy. Cardiovascular: Normal rate, regular rhythm. Normal S1 and S2.  Good peripheral circulation. Respiratory: Normal respiratory effort without tachypnea or retractions. Lungs CTAB. Good air entry to the bases with no decreased or absent breath sounds. Gastrointestinal: Bowel sounds 4 quadrants. Soft and nontender to palpation. No guarding or rigidity. No palpable masses. No distention. No CVA  tenderness. Musculoskeletal: Full range of motion to all extremities. No gross deformities appreciated. Neurologic:  Normal speech and language. No gross focal neurologic deficits are appreciated.  Skin:  Skin is warm, dry and intact. No rash noted. Psychiatric: Mood and affect are agitated.  Patient is verbally aggressive but not physically aggressive with her spouse at this time. Patient exhibits extremely limited insight and judgment.   ____________________________________________   LABS (all labs ordered are listed, but only abnormal results are displayed)  Labs Reviewed  COMPREHENSIVE METABOLIC PANEL - Abnormal; Notable for the following components:      Result Value   Potassium 3.3 (*)    Glucose, Bld 113 (*)    Calcium 8.5 (*)    Albumin 3.4 (*)    All other components within normal limits  URINALYSIS, COMPLETE (UACMP) WITH MICROSCOPIC - Abnormal; Notable for the following components:   Color, Urine YELLOW (*)    APPearance HAZY (*)    Leukocytes,Ua TRACE (*)    Bacteria, UA RARE (*)    All other components within normal limits  URINE DRUG SCREEN, QUALITATIVE ([facility] ONLY) - Abnormal; Notable for the following components:   Benzodiazepine, Ur Scrn POSITIVE (*)    All other components within normal limits  SALICYLATE LEVEL - Abnormal; Notable for the following components:   Salicylate Lvl <7.8 (*)    All other components within normal limits  CBG MONITORING, ED - Abnormal; Notable for the following components:   Glucose-Capillary 101 (*)    All other components within normal limits  CBC  ETHANOL   ____________________________________________  EKG   ____________________________________________  RADIOLOGY I personally viewed and evaluated these images as part of my medical decision making, as well as reviewing the written report by the radiologist.  ED Provider Interpretation: No acute findings on CT head.  Chest x-ray revealed findings that could be potential for  pneumonia on the lateral view.  CT scan of the chest reveals no evidence of pneumonia  DG Chest 2 View  Result Date: 07/02/2020 CLINICAL DATA:  Altered mental status. EXAM: CHEST - 2 VIEW COMPARISON:  03/26/2019 FINDINGS: Patient slightly rotated to the left. Lungs are adequately inflated without focal airspace consolidation or effusion. Increased focal density projecting over the anterior mid lungs extending to the hilar region seen only on the lateral film of uncertain clinical significance. Cardiomediastinal silhouette is normal. Remainder the exam is unchanged. IMPRESSION:  1. No acute cardiopulmonary disease. 2. Increased focal density projecting over the anterior mid lungs extending to the hilar region seen only on the lateral film. Recommend contrast-enhanced chest CT for further evaluation. Electronically Signed   By: Marin Olp M.D.   On: 07/02/2020 15:19   CT Head Wo Contrast  Result Date: 07/02/2020 CLINICAL DATA:  80 year old female with altered mental status. EXAM: CT HEAD WITHOUT CONTRAST TECHNIQUE: Contiguous axial images were obtained from the base of the skull through the vertex without intravenous contrast. COMPARISON:  Head CT dated 03/26/2019. FINDINGS: Brain: Mild age-related atrophy and chronic microvascular ischemic changes. There is no acute intracranial hemorrhage. No mass effect or midline shift. No extra-axial fluid collection. Vascular: No hyperdense vessel or unexpected calcification. Skull: Normal. Negative for fracture or focal lesion. Sinuses/Orbits: No acute finding. Other: None IMPRESSION: 1. No acute intracranial pathology. 2. Mild age-related atrophy and chronic microvascular ischemic changes. Electronically Signed   By: Anner Crete M.D.   On: 07/02/2020 15:09   CT Chest W Contrast  Result Date: 07/02/2020 CLINICAL DATA:  Possible pneumonia, effusion or abscess. Chest x-ray abnormality. EXAM: CT CHEST WITH CONTRAST TECHNIQUE: Multidetector CT imaging of the chest  was performed during intravenous contrast administration. CONTRAST:  72m OMNIPAQUE IOHEXOL 300 MG/ML  SOLN COMPARISON:  Chest x-ray today.  CT abdomen 03/26/2019. FINDINGS: Cardiovascular: Mild cardiomegaly. Calcified plaque over the left main and 3 vessel coronary arteries. Thoracic aorta is normal in caliber. Mild calcified plaque over the thoracic aorta. Visualized pulmonary arterial system is normal. Mediastinum/Nodes: No mediastinal or hilar adenopathy. Remaining mediastinal structures are normal. Lungs/Pleura: Lungs are adequately inflated without focal airspace consolidation or effusion. Minimal patchy linear atelectasis/scarring over the mid to lower lungs. No focal abnormality to correspond to the chest radiograph findings. Airways are normal. Upper Abdomen: Several liver cysts are present unchanged. Calcified plaque over the abdominal aorta. Diverticulosis of the colon. No acute findings. Musculoskeletal: Degenerative change of the spine. IMPRESSION: 1. No acute cardiopulmonary disease. Minimal patchy linear atelectasis/scarring over the mid to lower lungs. No concerning abnormality to correspond to the chest radiograph findings over the anterior mid lungs. 2. Aortic atherosclerosis. Atherosclerotic coronary artery disease. 3. Several liver cysts unchanged. 4. Colonic diverticulosis. Aortic Atherosclerosis (ICD10-I70.0). Electronically Signed   By: DMarin OlpM.D.   On: 07/02/2020 16:45    ____________________________________________    PROCEDURES  Procedure(s) performed:    Procedures    Medications  iohexol (OMNIPAQUE) 300 MG/ML solution 75 mL (75 mLs Intravenous Contrast Given 07/02/20 1548)     ____________________________________________   INITIAL IMPRESSION / ASSESSMENT AND PLAN / ED COURSE  Pertinent labs & imaging results that were available during my care of the patient were reviewed by me and considered in my medical decision making (see chart for details).  Review  of the [place] CSRS [place].           Patient's diagnosis is consistent with dementia, aggressive behavior.  Patient presented to emergency department for altered mental status/aggressive behavior.  According to the husband the patient does have dementia, does have some hallucinations at baseline.  Today patient had a sudden status change with becoming very verbally aggressive as well as endorsing to perform bodily harm on others.  In route to the emergency department patient was threatening to throw herself out of the car in which she was riding in.  Initially patient was very verbally aggressive towards myself and her  husband as we were discussing the work-up.  Patient has calm down throughout her stay somewhat, however she had made verbal threats of harming herself, stabbing her daughter, hurting her husband on discharge.  As such, patient has been medically cleared with no evidence of urinary tract infection, CVA, pneumonia, ACS, electrolyte abnormality that would be correctable for patient sudden change.  Patient will see psychiatry and will likely require placement in memory care..    ____________________________________________  FINAL CLINICAL IMPRESSION(S) / ED DIAGNOSES  Final diagnoses:  Lewy body dementia with behavioral disturbance ([place])      NEW MEDICATIONS STARTED DURING THIS VISIT:  ED Discharge Orders    None          This chart was dictated using voice recognition software/Dragon. Despite best efforts to proofread, errors can occur which can change the meaning. Any change was purely unintentional.    Darletta Moll, PA-C 07/02/20 Reinaldo Berber, MD 07/03/20 1140

## 2020-07-02 NOTE — BH Assessment (Signed)
Comprehensive Clinical Assessment (CCA) Screening, Triage and Referral Note  07/02/2020 Lindsay Chung [id number]   Patient is a 80y.o. female who presents to [facility] ED voluntarily for treatment. Per triage note, Pt via POV from home. Pt is accompanied by her husband. When asked the pt whats going on, "I don't know whats going on. I need to get out of here." Per husband, pt has been increasingly more agitated and confused. 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 During triage, pt is verbally abusive to husband. Pt states, "im going to file a claim against him, he just needs to shut up." Per husband, she has never spoken to him like that and this has all happened suddenly. Pt is alert and oriented to self, situation only.  During TTS assessment patient presents calm, and oriented x 3, cooperative, and mood-congruent with affect. The patient does not appear to be responding to internal or external stimuli. Neither is the pt presenting with any delusional thinking. Pt was able to verify the information provided to triage RN. Pt identified her main compliant to be that she would like to find a memory care unit. Patient stated that she's been having some depression and reported problems sleeping. Patient's husband stated that  the patient has Lewy Body Dementia. Patient's husband stated that earlier this morning the patient became very excited and started saying "I gotta leave this place". Patient's husband stated that the he and the patient currently reside in an apartment and lately the patient has become more confused, having some mood swings, not sleeping well and having nightmares. Patient's husband also reports that the patient has been hallucinating of her deceased mother. Patient's husband stated that the patient doesn't have good mobility and is not able to drive. Patient reports a MH hx of Depression and Anxiety in addition to her Dementia. Patient's husband reports  that she's compliant with her medications although he doesn't think her medications are working. Patient denies any SA hx. Patient reports no previous INPT or OPT hx. Patient expressed the need for getting assistance with locating a memory care unit for herself. Pt denies any current SI/HI/AH/VH and contracts for safety.    Final disposition pending SOC   Chief Complaint:  Chief Complaint  Patient presents with  . Altered Mental Status   Visit Diagnosis: Dementia  Patient Reported Information How did you hear about [place]? Family/Friend   Referral name: Justene Jensen (husband)   Referral phone number: No data recorded Whom do you see for routine medical problems? I don't have a doctor   Practice/Facility Name: No data recorded  Practice/Facility Phone Number: No data recorded  Name of Contact: No data recorded  Contact Number: No data recorded  Contact Fax Number: No data recorded  Prescriber Name: No data recorded  Prescriber Address (if known): No data recorded What Is the Reason for Your Visit/Call Today? Altered Mental Status  How Long Has This Been Causing You Problems? <Week  Have You Recently Been in Any Inpatient Treatment (Hospital/Detox/Crisis Center/28-Day Program)? No   Name/Location of Program/Hospital:No data recorded  How Long Were You There? No data recorded  When Were You Discharged? No data recorded Have You Ever Received Services From [facility] Before? Yes   Who Do You See at [facility]? ED  Have You Recently Had Any Thoughts About Hurting Yourself? No   Are You Planning to Commit Suicide/Harm Yourself At This time?  No  Have you  Recently Had Thoughts About [place]? No   Explanation: No data recorded Have You Used Any Alcohol or Drugs in the Past 24 Hours? No   How Long Ago Did You Use Drugs or Alcohol?  No data recorded  What Did You Use and How Much? No data recorded What Do You Feel Would Help You the Most Today? Housing Assistance  ([place] [facility] (requested))  Do You Currently Have a Therapist/Psychiatrist? Yes   Name of Therapist/Psychiatrist: Dr. Patriciaann Clan @ [facility] Recently Discharged From Any Office Practice or Programs? No   Explanation of Discharge From Practice/Program:  No data recorded    CCA Screening Triage Referral Assessment Type of Contact: Face-to-Face   Is this Initial or Reassessment? No data recorded  Date Telepsych consult ordered in [facility]:  No data recorded  Time Telepsych consult ordered in [facility]:  No data recorded Patient Reported Information Reviewed? Yes   Patient Left Without Being Seen? No data recorded  Reason for Not Completing Assessment: No data recorded Collateral Involvement: Marlyne Totaro - husband  Does Patient Have a Court Appointed Legal Guardian? No data recorded  Name and Contact of Legal Guardian:  No data recorded If Minor and Not Living with Parent(s), Who has Custody? No data recorded Is CPS involved or ever been involved? Never  Is APS involved or ever been involved? Never  Patient Determined To Be At Risk for Harm To Self or Others Based on Review of Patient Reported Information or Presenting Complaint? No   Method: No data recorded  Availability of Means: No data recorded  Intent: No data recorded  Notification Required: No data recorded  Additional Information for Danger to Others Potential:  No data recorded  Additional Comments for Danger to Others Potential:  No data recorded  Are There Guns or Other Weapons in Your Home?  No data recorded   Types of Guns/Weapons: No data recorded   Are These Weapons Safely Secured?                              No data recorded   Who Could Verify You Are Able To Have These Secured:    No data recorded Do You Have any Outstanding Charges, Pending Court Dates, Parole/Probation? No data recorded Contacted To Inform of Risk of Harm To Self or Others: No data recorded Location of Assessment: [facility]  ED  Does Patient Present under Involuntary Commitment? No   IVC Papers Initial File Date: No data recorded  [place] of Residence: [place]  Patient Currently Receiving the Following Services: Medication Management   Determination of Need: Emergent (2 hours)   Options For Referral: Other: Comment (Memory Care Unit Requeted)   Mathis Bud

## 2020-07-02 NOTE — ED Notes (Signed)
Patient agitated with husband states she's going to divorce him for bringing her to hospital for no reason. Pt agitated and wanting to go home. Pt reoriented to situation.

## 2020-07-03 DIAGNOSIS — G3183 Dementia with Lewy bodies: Secondary | ICD-10-CM

## 2020-07-03 DIAGNOSIS — F0281 Dementia in other diseases classified elsewhere with behavioral disturbance: Secondary | ICD-10-CM | POA: Diagnosis not present

## 2020-07-03 LAB — RESP PANEL BY RT-PCR (FLU A&B, COVID) ARPGX2
Influenza A by PCR: NEGATIVE
Influenza B by PCR: NEGATIVE
SARS Coronavirus 2 by RT PCR: NEGATIVE

## 2020-07-03 MED ORDER — LORAZEPAM 2 MG/ML IJ SOLN
0.5000 mg | Freq: Once | INTRAMUSCULAR | Status: AC
  Administered 2020-07-03: 0.5 mg via INTRAVENOUS
  Filled 2020-07-03: qty 1

## 2020-07-03 NOTE — ED Provider Notes (Signed)
Emergency Medicine Observation Re-evaluation Note  Lindsay Chung is a 80 y.o. female, seen on rounds today.  Pt initially presented to the ED for complaints of Altered Mental Status Currently, the patient is agitated but has a sitter at the bedside and he is redirectable.  Physical Exam  BP 128/78   Pulse 79   Temp 97.6 F (36.4 C) (Oral)   Resp 17   Ht 5\' 3"  (1.6 m)   Wt 68.5 kg   SpO2 95%   BMI 26.75 kg/m  Physical Exam Gen: No acute distress  Resp: Normal rise and fall of chest Neuro: Moving all four extremities Psych: Resting currently, calm and cooperative when awake    ED Course / MDM  EKG:   I have reviewed the labs performed to date as well as medications administered while in observation.  Recent changes in the last 24 hours include patient intermittently requiring Ativan for agitation.  Plan  Current plan is for psychiatric evaluation for potential Geri psych placement. Patient is under full IVC at this time.   Jalynn Betzold, Delice Bison, DO 07/03/20 [phone]

## 2020-07-03 NOTE — Consult Note (Signed)
Brief note.  Full note to follow.  Patient seen chart reviewed.  Discontinued IVC as patient no longer meets commitment criteria.  Patient may be considered psychiatrically cleared.

## 2020-07-03 NOTE — Consult Note (Signed)
[facility] Consult   Reason for Consult: Consult follow-up for 80 year old woman came to the emergency room because of agitation at home Referring Physician: Shelbie Ammons Patient Identification: Lindsay Chung MRN:  [id number] Principal Diagnosis: Lewy body dementia with behavioral disturbance ([place]) Diagnosis:  Principal Problem:   Lewy body dementia with behavioral disturbance ([place])   Total Time spent with patient: 1 hour  Subjective:   Lindsay Chung is a 80 y.o. female patient admitted with I do not know".  HPI: Patient seen chart reviewed.  80 year old woman was brought to the hospital by her husband with report that she has been increasingly agitated at home making hostile statements.  Getting worse over the past few days.  Unclear reason.  Documented that she was verbally abusive of her husband when he was bringing her in.  No report of any dangerous violence.  Patient has a diagnosis of Lewy body dementia already established.  On interview the patient is clearly extremely impaired in her memory and cognition.  Knows that she is in the hospital but tells me that she is 80 years old and lives with her mother and father and sister.  Denies having a husband.  Patient was calm throughout the conversation and denies any suicidal or homicidal ideation did not show any signs of aggression or agitation.  No sign of substance abuse.  Patient seems to have responded to low-dose Risperdal with calming down.  Past Psychiatric History: Past history of anxiety and depression is reported.  No known history of violence or hospitalization or suicide attempt is reported  Risk to Self:   Risk to Others:   Prior Inpatient Therapy:   Prior Outpatient Therapy:    Past Medical History:  Past Medical History:  Diagnosis Date  . Anxiety   . Fibromyalgia   . GERD (gastroesophageal reflux disease)   . Hyperlipidemia   . Hypertension   . Irritable bowel syndrome   . Lewy body dementia ([place])   .  Memory difficulty     Past Surgical History:  Procedure Laterality Date  . BREAST BIOPSY Left    neg  . BREAST BIOPSY Left 06/16/2018   affirm stereo biopsy/ x clip/ path pending  . CATARACT EXTRACTION W/PHACO Left 02/21/2020   Procedure: CATARACT EXTRACTION PHACO AND INTRAOCULAR LENS PLACEMENT ([place]) LEFT;  Surgeon: Eulogio Bear, MD;  Location: [facility];  Service: Ophthalmology;  Laterality: Left;  6.37 0:40.0  . CATARACT EXTRACTION W/PHACO Right 03/13/2020   Procedure: CATARACT EXTRACTION PHACO AND INTRAOCULAR LENS PLACEMENT ([place]) RIGHT;  Surgeon: Eulogio Bear, MD;  Location: [facility];  Service: Ophthalmology;  Laterality: Right;  6.72 0:42.2  . COLONOSCOPY W/ POLYPECTOMY    . COLONOSCOPY WITH PROPOFOL N/A 10/21/2014   Procedure: COLONOSCOPY WITH PROPOFOL;  Surgeon: Manya Silvas, MD;  Location[facility] ENDOSCOPY;  Service: Endoscopy;  Laterality: N/A;  . ESOPHAGOGASTRODUODENOSCOPY    . GYNECOLOGIC CRYOSURGERY    . TUBAL LIGATION     Family History:  Family History  Problem Relation Age of Onset  . Breast cancer Neg Hx    Family Psychiatric  History: None known Social History:  Social History   Substance and Sexual Activity  Alcohol Use Not Currently     Social History   Substance and Sexual Activity  Drug Use Not Currently    Social History   Socioeconomic History  . Marital status: Married    Spouse name: Not on file  . Number of children: Not on  file  . Years of education: Not on file  . Highest education level: Not on file  Occupational History  . Not on file  Tobacco Use  . Smoking status: Former Research scientist (life sciences)  . Smokeless tobacco: Never Used  . Tobacco comment: "quit years ago"  Vaping Use  . Vaping Use: Never used  Substance and Sexual Activity  . Alcohol use: Not Currently  . Drug use: Not Currently  . Sexual activity: Not Currently  Other Topics Concern  . Not on file  Social History Narrative  . Not on file   Social  Determinants of Health   Financial Resource Strain: Not on file  Food Insecurity: Not on file  Transportation Needs: Not on file  Physical Activity: Not on file  Stress: Not on file  Social Connections: Not on file   Additional Social History:    Allergies:   Allergies  Allergen Reactions  . Amoxicillin Other (See Comments)    Patient states that she can take this medication.     Labs:  Results for orders placed or performed during the hospital encounter of 07/02/20 (from the past 48 hour(s))  Comprehensive metabolic panel     Status: Abnormal   Collection Time: 07/02/20 12:34 PM  Result Value Ref Range   Sodium 139 135 - 145 mmol/L   Potassium 3.3 (L) 3.5 - 5.1 mmol/L   Chloride 105 98 - 111 mmol/L   CO2 25 22 - 32 mmol/L   Glucose, Bld 113 (H) 70 - 99 mg/dL    Comment: Glucose reference range applies only to samples taken after fasting for at least 8 hours.   BUN 14 8 - 23 mg/dL   Creatinine, Ser 0.93 0.44 - 1.00 mg/dL   Calcium 8.5 (L) 8.9 - 10.3 mg/dL   Total Protein 6.5 6.5 - 8.1 g/dL   Albumin 3.4 (L) 3.5 - 5.0 g/dL   AST 29 15 - 41 U/L   ALT 11 0 - 44 U/L   Alkaline Phosphatase 87 38 - 126 U/L   Total Bilirubin 0.7 0.3 - 1.2 mg/dL   GFR, Estimated >60 >60 mL/min    Comment: (NOTE) Calculated using the CKD-EPI Creatinine Equation (2021)    Anion gap 9 5 - 15    Comment: Performed at [facility], [address] [place], [place] [address]  CBC     Status: None   Collection Time: 07/02/20 12:34 PM  Result Value Ref Range   WBC 5.9 4.0 - 10.5 K/uL   RBC 4.21 3.87 - 5.11 MIL/uL   Hemoglobin 12.2 12.0 - 15.0 g/dL   HCT 37.4 36.0 - 46.0 %   MCV 88.8 80.0 - 100.0 fL   MCH 29.0 26.0 - 34.0 pg   MCHC 32.6 30.0 - 36.0 g/dL   RDW 13.1 11.5 - 15.5 %   Platelets 185 150 - 400 K/uL   nRBC 0.0 0.0 - 0.2 %    Comment: Performed at [facility], [address] [place], [place] [address]  CBG monitoring, ED     Status: Abnormal   Collection Time:  07/02/20  1:25 PM  Result Value Ref Range   Glucose-Capillary 101 (H) 70 - 99 mg/dL    Comment: Glucose reference range applies only to samples taken after fasting for at least 8 hours.  Urinalysis, Complete w Microscopic     Status: Abnormal   Collection Time: 07/02/20  1:29 PM  Result Value Ref Range   Color, Urine YELLOW (A)  YELLOW   APPearance HAZY (A) CLEAR   Specific Gravity, Urine 1.012 1.005 - 1.030   pH 6.0 5.0 - 8.0   Glucose, UA NEGATIVE NEGATIVE mg/dL   Hgb urine dipstick NEGATIVE NEGATIVE   Bilirubin Urine NEGATIVE NEGATIVE   Ketones, ur NEGATIVE NEGATIVE mg/dL   Protein, ur NEGATIVE NEGATIVE mg/dL   Nitrite NEGATIVE NEGATIVE   Leukocytes,Ua TRACE (A) NEGATIVE   RBC / HPF 0-5 0 - 5 RBC/hpf   WBC, UA 0-5 0 - 5 WBC/hpf   Bacteria, UA RARE (A) NONE SEEN   Squamous Epithelial / LPF 0-5 0 - 5   Mucus PRESENT     Comment: Performed at [facility], [address] [place], [place] [address]  Ethanol     Status: None   Collection Time: 07/02/20  1:29 PM  Result Value Ref Range   Alcohol, Ethyl (B) <10 <10 mg/dL    Comment: (NOTE) Lowest detectable limit for serum alcohol is 10 mg/dL.  For medical purposes only. Performed at [facility], [address] [place], [place] [address]   Urine Drug Screen, Qualitative     Status: Abnormal   Collection Time: 07/02/20  1:29 PM  Result Value Ref Range   Tricyclic, Ur Screen NONE DETECTED NONE DETECTED   Amphetamines, Ur Screen NONE DETECTED NONE DETECTED   MDMA (Ecstasy)Ur Screen NONE DETECTED NONE DETECTED   Cocaine Metabolite,Ur [place] NONE DETECTED NONE DETECTED   Opiate, Ur Screen NONE DETECTED NONE DETECTED   Phencyclidine (PCP) Ur S NONE DETECTED NONE DETECTED   Cannabinoid 50 Ng, Ur [place] NONE DETECTED NONE DETECTED   Barbiturates, Ur Screen NONE DETECTED NONE DETECTED   Benzodiazepine, Ur Scrn POSITIVE (A) NONE DETECTED   Methadone Scn, Ur NONE DETECTED NONE DETECTED    Comment: (NOTE) Tricyclics +  metabolites, urine    Cutoff 1000 ng/mL Amphetamines + metabolites, urine  Cutoff 1000 ng/mL MDMA (Ecstasy), urine              Cutoff 500 ng/mL Cocaine Metabolite, urine          Cutoff 300 ng/mL Opiate + metabolites, urine        Cutoff 300 ng/mL Phencyclidine (PCP), urine         Cutoff 25 ng/mL Cannabinoid, urine                 Cutoff 50 ng/mL Barbiturates + metabolites, urine  Cutoff 200 ng/mL Benzodiazepine, urine              Cutoff 200 ng/mL Methadone, urine                   Cutoff 300 ng/mL  The urine drug screen provides only a preliminary, unconfirmed analytical test result and should not be used for non-medical purposes. Clinical consideration and professional judgment should be applied to any positive drug screen result due to possible interfering substances. A more specific alternate chemical method must be used in order to obtain a confirmed analytical result. Gas chromatography / mass spectrometry (GC/MS) is the preferred confirm atory method. Performed at [facility], [place], [place] [address]   Salicylate level     Status: Abnormal   Collection Time: 07/02/20  3:05 PM  Result Value Ref Range   Salicylate Lvl <6.0 (L) 7.0 - 30.0 mg/dL    Comment: Performed at [facility], [address] [place], [place] [address]  Resp Panel by RT-PCR (Flu A&B, Covid) Nasopharyngeal Swab  Status: None   Collection Time: 07/03/20  1:39 AM   Specimen: Nasopharyngeal Swab; Nasopharyngeal(NP) swabs in vial transport medium  Result Value Ref Range   SARS Coronavirus 2 by RT PCR NEGATIVE NEGATIVE    Comment: (NOTE) SARS-CoV-2 target nucleic acids are NOT DETECTED.  The SARS-CoV-2 RNA is generally detectable in upper respiratory specimens during the acute phase of infection. The lowest concentration of SARS-CoV-2 viral copies this assay can detect is 138 copies/mL. A negative result does not preclude SARS-Cov-2 infection and should not be  used as the sole basis for treatment or other patient management decisions. A negative result may occur with  improper specimen collection/handling, submission of specimen other than nasopharyngeal swab, presence of viral mutation(s) within the areas targeted by this assay, and inadequate number of viral copies(<138 copies/mL). A negative result must be combined with clinical observations, patient history, and epidemiological information. The expected result is Negative.  Fact Sheet for Patients:  EntrepreneurPulse.com.au  Fact Sheet for Healthcare Providers:  IncredibleEmployment.be  This test is no t yet approved or cleared by the [place] FDA and  has been authorized for detection and/or diagnosis of SARS-CoV-2 by FDA under an Emergency Use Authorization (EUA). This EUA will remain  in effect (meaning this test can be used) for the duration of the COVID-19 declaration under Section 564(b)(1) of the Act, 21 U.S.C.section 360bbb-3(b)(1), unless the authorization is terminated  or revoked sooner.       Influenza A by PCR NEGATIVE NEGATIVE   Influenza B by PCR NEGATIVE NEGATIVE    Comment: (NOTE) The Xpert Xpress SARS-CoV-2/FLU/RSV plus assay is intended as an aid in the diagnosis of influenza from Nasopharyngeal swab specimens and should not be used as a sole basis for treatment. Nasal washings and aspirates are unacceptable for Xpert Xpress SARS-CoV-2/FLU/RSV testing.  Fact Sheet for Patients: EntrepreneurPulse.com.au  Fact Sheet for Healthcare Providers: IncredibleEmployment.be  This test is not yet approved or cleared by the [place] FDA and has been authorized for detection and/or diagnosis of SARS-CoV-2 by FDA under an Emergency Use Authorization (EUA). This EUA will remain in effect (meaning this test can be used) for the duration of the COVID-19 declaration under Section 564(b)(1) of the  Act, 21 U.S.C. section 360bbb-3(b)(1), unless the authorization is terminated or revoked.  Performed at [facility], [address], [place], [place] [address]     Current Facility-Administered Medications  Medication Dose Route Frequency Provider Last Rate Last Admin  . aspirin EC tablet 81 mg  81 mg Oral Daily Delman Kitten, MD      . cholecalciferol (VITAMIN D3) tablet 2,000 Units  2,000 Units Oral Daily Delman Kitten, MD      . donepezil (ARICEPT) tablet 5 mg  5 mg Oral Daily Delman Kitten, MD      . FLUoxetine (PROZAC) capsule 20 mg  20 mg Oral BID Delman Kitten, MD      . memantine [facility]) tablet 5 mg  5 mg Oral Daily Delman Kitten, MD      . metoprolol tartrate (LOPRESSOR) tablet 25 mg  25 mg Oral BID Delman Kitten, MD      . multivitamin with minerals tablet 1 tablet  1 tablet Oral Daily Delman Kitten, MD      . pantoprazole (PROTONIX) EC tablet 40 mg  40 mg Oral Daily Delman Kitten, MD      . risperiDONE (RISPERDAL) tablet 0.25 mg  0.25 mg Oral QHS Delman Kitten, MD  Current Outpatient Medications  Medication Sig Dispense Refill  . acetaminophen (TYLENOL) 500 MG tablet Take 1,000 mg by mouth every 6 (six) hours as needed for mild pain.    Marland Kitchen ALPRAZolam (XANAX) 0.5 MG tablet Take 0.5 mg by mouth 3 (three) times daily.   1  . aspirin 81 MG tablet Take 81 mg by mouth daily.    . calcium-vitamin D (OSCAL WITH D) 500-200 MG-UNIT tablet Take 1 tablet by mouth 2 (two) times daily. 60 tablet 0  . cetirizine (ZYRTEC) 10 MG tablet Take 10 mg by mouth daily.    . Cholecalciferol (VITAMIN D) 50 MCG (2000 UT) tablet Take 2,000 Units by mouth daily.     . clobetasol cream (TEMOVATE) 5.02 % Apply 1 application topically as needed.    . donepezil (ARICEPT) 5 MG tablet Take 5 mg by mouth daily.     Marland Kitchen FLUoxetine (PROZAC) 20 MG capsule Take 1 capsule (20 mg total) by mouth 2 (two) times daily. 30 capsule 0  . fluvastatin (LESCOL) 40 MG capsule Take 40 mg by mouth at bedtime.     . memantine  (NAMENDA) 5 MG tablet Take 5 mg by mouth daily.    . metoprolol tartrate (LOPRESSOR) 25 MG tablet Take 25 mg by mouth 2 (two) times daily.   3  . Multiple Vitamin (MULTIVITAMIN) tablet Take 1 tablet by mouth daily.    Marland Kitchen omeprazole (PRILOSEC) 20 MG capsule Take 20 mg by mouth daily.   3  . risperiDONE (RISPERDAL) 0.25 MG tablet Take 0.25 mg by mouth at bedtime.      Musculoskeletal: Strength & Muscle Tone: decreased Gait & Station: unsteady Patient leans: N/A            Psychiatric Specialty Exam:  Presentation  General Appearance: No data recorded Eye Contact:No data recorded Speech:No data recorded Speech Volume:No data recorded Handedness:No data recorded  Mood and Affect  Mood:No data recorded Affect:No data recorded  Thought Process  Thought Processes:No data recorded Descriptions of Associations:No data recorded Orientation:No data recorded Thought Content:No data recorded History of Schizophrenia/Schizoaffective disorder:No data recorded Duration of Psychotic Symptoms:No data recorded Hallucinations:No data recorded Ideas of Reference:No data recorded Suicidal Thoughts:No data recorded Homicidal Thoughts:No data recorded  Sensorium  Memory:No data recorded Judgment:No data recorded Insight:No data recorded  Executive Functions  Concentration:No data recorded Attention Span:No data recorded Recall:No data recorded Fund of Knowledge:No data recorded Language:No data recorded  Psychomotor Activity  Psychomotor Activity:No data recorded  Assets  Assets:No data recorded  Sleep  Sleep:No data recorded  Physical Exam: Physical Exam Vitals and nursing note reviewed.  Constitutional:      Appearance: Normal appearance.  HENT:     Head: Normocephalic and atraumatic.     Mouth/Throat:     Pharynx: Oropharynx is clear.  Eyes:     Pupils: Pupils are equal, round, and reactive to light.  Cardiovascular:     Rate and Rhythm: Normal rate and  regular rhythm.  Pulmonary:     Effort: Pulmonary effort is normal.     Breath sounds: Normal breath sounds.  Abdominal:     General: Abdomen is flat.     Palpations: Abdomen is soft.  Musculoskeletal:        General: Normal range of motion.  Skin:    General: Skin is warm and dry.  Neurological:     General: No focal deficit present.     Mental Status: Mental status is at baseline.  Psychiatric:  Attention and Perception: She is inattentive.        Mood and Affect: Affect is blunt.        Speech: Speech is delayed.        Behavior: Behavior is slowed.        Thought Content: Thought content is delusional. Thought content does not include homicidal or suicidal ideation.        Cognition and Memory: Cognition is impaired. She exhibits impaired recent memory and impaired remote memory.        Judgment: Judgment is impulsive.    Review of Systems  Constitutional: Negative.   HENT: Negative.   Eyes: Negative.   Respiratory: Negative.   Cardiovascular: Negative.   Gastrointestinal: Negative.   Musculoskeletal: Negative.   Skin: Negative.   Neurological: Negative.   Psychiatric/Behavioral: Negative.    Blood pressure 110/78, pulse 96, temperature 97.6 F (36.4 C), temperature source Oral, resp. rate 20, height 5\' 3"  (1.6 m), weight 68.5 kg, SpO2 94 %. Body mass index is 26.75 kg/m.  Treatment Plan Summary: Plan 80 year old woman with established diagnosis of dementia transiently showing more agitation at home.  Since being in the emergency room has improved significantly.  Not currently threatening agitated at all.  Denies suicidal thoughts.  Patient does not meet commitment criteria and does not require attempts at psychiatric hospitalization.  Agree with plan to provide prescription for Risperdal to be used as needed in for follow-up with outpatient providers.  Discontinue IVC.  Case reviewed with emergency room physician.  Disposition: No evidence of imminent risk to  self or others at present.   Patient does not meet criteria for psychiatric inpatient admission.  Alethia Berthold, MD 07/03/2020 1:31 PM

## 2020-07-03 NOTE — ED Notes (Addendum)
Pt agreed to change out.   1 bag of belongings include:  1 pink pajama shirt  1 pink pajama pants   Attempted to remove wedding rings, pt did not want removed. Pt has a hx of agitation and this RN did not want to increased agitation at this time.

## 2020-07-03 NOTE — ED Notes (Signed)
Patient increasingly agitated about having to stay the night in the hospital. Patient states "I don't have a job, I need to go". Will continue to monitor.

## 2020-07-03 NOTE — ED Notes (Signed)
Patient's belongings placed in plastic bag, labeled with green sticker and patient label. Patient remains in own clothes at this time.

## 2020-07-03 NOTE — ED Notes (Signed)
Pt currently sleeping at this time. Breakfast tray placed in the room.

## 2020-07-03 NOTE — ED Notes (Signed)
Pt ambulated to the restroom pt is a 2-person assist.

## 2020-07-04 LAB — URINE CULTURE: Culture: 80000 — AB

## 2020-07-11 ENCOUNTER — Emergency Department
Admission: EM | Admit: 2020-07-11 | Discharge: 2020-07-13 | Disposition: A | Discharge status: Home / Self Care | Payer: Medicare Other | Attending: Emergency Medicine | Admitting: Emergency Medicine

## 2020-07-11 ENCOUNTER — Other Ambulatory Visit: Payer: Self-pay

## 2020-07-11 ENCOUNTER — Emergency Department: Payer: Medicare Other

## 2020-07-11 ENCOUNTER — Encounter: Payer: Self-pay | Admitting: Emergency Medicine

## 2020-07-11 DIAGNOSIS — Z7982 Long term (current) use of aspirin: Secondary | ICD-10-CM | POA: Diagnosis not present

## 2020-07-11 DIAGNOSIS — R531 Weakness: Secondary | ICD-10-CM | POA: Diagnosis present

## 2020-07-11 DIAGNOSIS — Z87891 Personal history of nicotine dependence: Secondary | ICD-10-CM | POA: Insufficient documentation

## 2020-07-11 DIAGNOSIS — Z20822 Contact with and (suspected) exposure to covid-19: Secondary | ICD-10-CM | POA: Insufficient documentation

## 2020-07-11 DIAGNOSIS — Z79899 Other long term (current) drug therapy: Secondary | ICD-10-CM | POA: Insufficient documentation

## 2020-07-11 DIAGNOSIS — R2689 Other abnormalities of gait and mobility: Secondary | ICD-10-CM | POA: Insufficient documentation

## 2020-07-11 DIAGNOSIS — I10 Essential (primary) hypertension: Secondary | ICD-10-CM | POA: Diagnosis not present

## 2020-07-11 DIAGNOSIS — G3183 Dementia with Lewy bodies: Secondary | ICD-10-CM | POA: Insufficient documentation

## 2020-07-11 LAB — BASIC METABOLIC PANEL
Anion gap: 9 (ref 5–15)
BUN: 17 mg/dL (ref 8–23)
CO2: 26 mmol/L (ref 22–32)
Calcium: 8.8 mg/dL — ABNORMAL LOW (ref 8.9–10.3)
Chloride: 100 mmol/L (ref 98–111)
Creatinine, Ser: 0.94 mg/dL (ref 0.44–1.00)
GFR, Estimated: >60 mL/min (ref >60)
Glucose, Bld: 122 mg/dL — ABNORMAL HIGH (ref 70–99)
Potassium: 3.7 mmol/L (ref 3.5–5.1)
Sodium: 135 mmol/L (ref 135–145)

## 2020-07-11 LAB — URINALYSIS, COMPLETE (UACMP) WITH MICROSCOPIC
Bacteria, UA: NONE SEEN
Bilirubin Urine: NEGATIVE
Glucose, UA: NEGATIVE mg/dL
Hgb urine dipstick: NEGATIVE
Ketones, ur: 20 mg/dL — AB
Nitrite: NEGATIVE
Protein, ur: 30 mg/dL — AB
Specific Gravity, Urine: 1.015 (ref 1.005–1.030)
pH: 7 (ref 5.0–8.0)

## 2020-07-11 LAB — RESP PANEL BY RT-PCR (FLU A&B, COVID) ARPGX2
Influenza A by PCR: NEGATIVE
Influenza B by PCR: NEGATIVE
SARS Coronavirus 2 by RT PCR: NEGATIVE

## 2020-07-11 LAB — CBC
HCT: 37.5 % (ref 36.0–46.0)
Hemoglobin: 12.4 g/dL (ref 12.0–15.0)
MCH: 29 pg (ref 26.0–34.0)
MCHC: 33.1 g/dL (ref 30.0–36.0)
MCV: 87.8 fL (ref 80.0–100.0)
Platelets: 198 10*3/uL (ref 150–400)
RBC: 4.27 MIL/uL (ref 3.87–5.11)
RDW: 13 % (ref 11.5–15.5)
WBC: 6.4 10*3/uL (ref 4.0–10.5)
nRBC: 0 % (ref 0.0–0.2)

## 2020-07-11 MED ORDER — LORATADINE 10 MG PO TABS
10.0000 mg | ORAL_TABLET | Freq: Every day | ORAL | Status: DC
  Administered 2020-07-11 – 2020-07-13 (×3): 10 mg via ORAL
  Filled 2020-07-11 (×3): qty 1

## 2020-07-11 MED ORDER — BUSPIRONE HCL 5 MG PO TABS
15.0000 mg | ORAL_TABLET | Freq: Three times a day (TID) | ORAL | Status: DC
  Administered 2020-07-11 – 2020-07-13 (×5): 15 mg via ORAL
  Filled 2020-07-11 (×4): qty 3

## 2020-07-11 MED ORDER — MEMANTINE HCL 5 MG PO TABS
5.0000 mg | ORAL_TABLET | Freq: Two times a day (BID) | ORAL | Status: DC
  Administered 2020-07-11 – 2020-07-13 (×4): 5 mg via ORAL
  Filled 2020-07-11 (×4): qty 1

## 2020-07-11 MED ORDER — PRAVASTATIN SODIUM 20 MG PO TABS
20.0000 mg | ORAL_TABLET | Freq: Every day | ORAL | Status: DC
  Administered 2020-07-12: 20 mg via ORAL
  Filled 2020-07-11 (×2): qty 1

## 2020-07-11 MED ORDER — VITAMIN D 25 MCG (1000 UNIT) PO TABS
2000.0000 [IU] | ORAL_TABLET | Freq: Every day | ORAL | Status: DC
  Administered 2020-07-12 – 2020-07-13 (×2): 2000 [IU] via ORAL
  Filled 2020-07-11 (×2): qty 2

## 2020-07-11 MED ORDER — ADULT MULTIVITAMIN W/MINERALS CH
1.0000 | ORAL_TABLET | Freq: Every day | ORAL | Status: DC
  Administered 2020-07-12: 1 via ORAL
  Filled 2020-07-11: qty 1

## 2020-07-11 MED ORDER — RISPERIDONE 1 MG PO TABS
0.5000 mg | ORAL_TABLET | Freq: Two times a day (BID) | ORAL | Status: DC
  Administered 2020-07-11 – 2020-07-13 (×4): 0.5 mg via ORAL
  Filled 2020-07-11 (×4): qty 1

## 2020-07-11 MED ORDER — VITAMIN B-12 1000 MCG PO TABS
1000.0000 ug | ORAL_TABLET | Freq: Every day | ORAL | Status: DC
  Administered 2020-07-12: 1000 ug via ORAL
  Filled 2020-07-11 (×2): qty 1

## 2020-07-11 MED ORDER — PANTOPRAZOLE SODIUM 40 MG PO TBEC
40.0000 mg | DELAYED_RELEASE_TABLET | Freq: Every day | ORAL | Status: DC
  Administered 2020-07-11 – 2020-07-13 (×3): 40 mg via ORAL
  Filled 2020-07-11 (×2): qty 1

## 2020-07-11 MED ORDER — METOPROLOL TARTRATE 25 MG PO TABS
25.0000 mg | ORAL_TABLET | Freq: Two times a day (BID) | ORAL | Status: DC
  Administered 2020-07-11 – 2020-07-13 (×4): 25 mg via ORAL
  Filled 2020-07-11 (×4): qty 1

## 2020-07-11 MED ORDER — ALPRAZOLAM 0.5 MG PO TABS
0.5000 mg | ORAL_TABLET | Freq: Three times a day (TID) | ORAL | Status: DC
  Administered 2020-07-11 – 2020-07-13 (×5): 0.5 mg via ORAL
  Filled 2020-07-11 (×5): qty 1

## 2020-07-11 MED ORDER — ASPIRIN EC 81 MG PO TBEC
81.0000 mg | DELAYED_RELEASE_TABLET | Freq: Every day | ORAL | Status: DC
  Administered 2020-07-12 – 2020-07-13 (×2): 81 mg via ORAL
  Filled 2020-07-11 (×2): qty 1

## 2020-07-11 NOTE — ED Triage Notes (Signed)
Pt via POV from home. Per husband, pt had been able to walk with a walker 2 days ago but since then she is unable to move at all. Husband states they need help with SNF placement. Pt has lewy body dementia.

## 2020-07-11 NOTE — ED Provider Notes (Signed)
[facility] Emergency Department Provider Note   ____________________________________________   Event Date/Time   First MD Initiated Contact with Patient 07/11/20 1529     (approximate)  I have reviewed the triage vital signs and the nursing notes.   HISTORY  Chief Complaint Weakness    HPI Lindsay Chung is a 80 y.o. female with a past medical history of Lewy body dementia, fibromyalgia, hypertension, and IBS who presents with her husband complaining of increased weakness over the last 3 days.  Husband is at bedside and provides this history as patient is severely demented.  States the patient usually gets around with a walker until 2 days ago when she has been "dead weight" and family including patient's daughter has had to perform all of her ADLs and move her around in a walker to the bathroom.  Husband states that he has been working on trying to get her into a skilled nursing facility but her home needs have become too great to wait.  Denies any recent travel, sick contacts, vomiting/diarrhea, or rash         Past Medical History:  Diagnosis Date  . Anxiety   . Fibromyalgia   . GERD (gastroesophageal reflux disease)   . Hyperlipidemia   . Hypertension   . Irritable bowel syndrome   . Lewy body dementia ([place])   . Memory difficulty     Patient Active Problem List   Diagnosis Date Noted  . Lewy body dementia with behavioral disturbance ([place]) 07/03/2020  . Hypokalemia 03/27/2019  . Anxiety 03/27/2019  . Dementia without behavioral disturbance ([place]) 03/27/2019  . Hypocalcemia 03/26/2019  . Hypomagnesemia   . Nausea without vomiting   . Poor appetite   . Diarrhea     Past Surgical History:  Procedure Laterality Date  . BREAST BIOPSY Left    neg  . BREAST BIOPSY Left 06/16/2018   affirm stereo biopsy/ x clip/ path pending  . CATARACT EXTRACTION W/PHACO Left 02/21/2020   Procedure: CATARACT EXTRACTION PHACO AND INTRAOCULAR LENS  PLACEMENT ([place]) LEFT;  Surgeon: Eulogio Bear, MD;  Location: [place];  Service: Ophthalmology;  Laterality: Left;  6.37 0:40.0  . CATARACT EXTRACTION W/PHACO Right 03/13/2020   Procedure: CATARACT EXTRACTION PHACO AND INTRAOCULAR LENS PLACEMENT ([place]) RIGHT;  Surgeon: Eulogio Bear, MD;  Location: [place];  Service: Ophthalmology;  Laterality: Right;  6.72 0:42.2  . COLONOSCOPY W/ POLYPECTOMY    . COLONOSCOPY WITH PROPOFOL N/A 10/21/2014   Procedure: COLONOSCOPY WITH PROPOFOL;  Surgeon: Manya Silvas, MD;  Location: [facility] ENDOSCOPY;  Service: Endoscopy;  Laterality: N/A;  . ESOPHAGOGASTRODUODENOSCOPY    . GYNECOLOGIC CRYOSURGERY    . TUBAL LIGATION      Prior to Admission medications   Medication Sig Start Date End Date Taking? Authorizing Provider  acetaminophen (TYLENOL) 500 MG tablet Take 1,000 mg by mouth every 6 (six) hours as needed for mild pain.    [provider]  ALPRAZolam Duanne Moron) 0.5 MG tablet Take 0.5 mg by mouth 3 (three) times daily.  08/10/14   [provider]  aspirin 81 MG tablet Take 81 mg by mouth daily.    [provider]  calcium-vitamin D (OSCAL WITH D) 500-200 MG-UNIT tablet Take 1 tablet by mouth 2 (two) times daily. 03/28/19   Thornell Mule, MD  cetirizine (ZYRTEC) 10 MG tablet Take 10 mg by mouth daily.    [provider]  Cholecalciferol (VITAMIN D) 50 MCG (2000 UT) tablet  Take 2,000 Units by mouth daily.     [provider]  clobetasol cream (TEMOVATE) 0.96 % Apply 1 application topically as needed.    [provider]  donepezil (ARICEPT) 5 MG tablet Take 5 mg by mouth daily.  03/17/19   [provider]  FLUoxetine (PROZAC) 20 MG capsule Take 1 capsule (20 mg total) by mouth 2 (two) times daily. 03/28/19   Thornell Mule, MD  fluvastatin (LESCOL) 40 MG capsule Take 40 mg by mouth at bedtime.     [provider]  memantine (NAMENDA) 5 MG tablet Take 5 mg by  mouth daily.    [provider]  metoprolol tartrate (LOPRESSOR) 25 MG tablet Take 25 mg by mouth 2 (two) times daily.     [provider]  Multiple Vitamin (MULTIVITAMIN) tablet Take 1 tablet by mouth daily.    [provider]  omeprazole (PRILOSEC) 20 MG capsule Take 20 mg by mouth daily.  06/12/14   [provider]  risperiDONE (RISPERDAL) 0.25 MG tablet Take 0.25 mg by mouth at bedtime.    [provider]    Allergies Amoxicillin  Family History  Problem Relation Age of Onset  . Breast cancer Neg Hx     Social History Social History   Tobacco Use  . Smoking status: Former Research scientist (life sciences)  . Smokeless tobacco: Never Used  . Tobacco comment: "quit years ago"  Vaping Use  . Vaping Use: Never used  Substance Use Topics  . Alcohol use: Not Currently  . Drug use: Not Currently    Review of Systems Unable to assess ____________________________________________   PHYSICAL EXAM:  VITAL SIGNS: ED Triage Vitals  Enc Vitals Group     BP 07/11/20 1432 124/72     Pulse Rate 07/11/20 1432 72     Resp 07/11/20 1432 20     Temp 07/11/20 1432 98.9 F (37.2 C)     Temp Source 07/11/20 1432 Oral     SpO2 07/11/20 1432 97 %     Weight 07/11/20 1453 149 lb 14.6 oz (68 kg)     Height 07/11/20 1453 5\' 3"  (1.6 m)     Head Circumference --      Peak Flow --      Pain Score --      Pain Loc --      Pain Edu? --      Excl. in [place]? --    Constitutional: Alert and disoriented. Well appearing elderly Caucasian female in no acute distress. Eyes: Conjunctivae are normal. PERRL. Head: Atraumatic. Nose: No congestion/rhinnorhea. Mouth/Throat: Mucous membranes are moist. Neck: No stridor Cardiovascular: Grossly normal heart sounds.  Good peripheral circulation. Respiratory: Normal respiratory effort.  No retractions. Gastrointestinal: Soft and nontender. No distention. Musculoskeletal: No obvious deformities Neurologic:  Normal speech is normal and  language is disoriented. No gross focal neurologic deficits are appreciated. Skin:  Skin is warm and dry. No rash noted. Psychiatric: Cooperative  ____________________________________________   LABS (all labs ordered are listed, but only abnormal results are displayed)  Labs Reviewed  BASIC METABOLIC PANEL - Abnormal; Notable for the following components:      Result Value   Glucose, Bld 122 (*)    Calcium 8.8 (*)    All other components within normal limits  RESP PANEL BY RT-PCR (FLU A&B, COVID) ARPGX2  CBC  URINALYSIS, COMPLETE (UACMP) WITH MICROSCOPIC   ____________________________________________  EKG  ED ECG REPORT I, Naaman Plummer, the attending physician, personally viewed  and interpreted this ECG.  Date: 07/11/2020 EKG Time: 1442 Rate: 72 Rhythm: normal sinus rhythm QRS Axis: normal Intervals: normal ST/T Wave abnormalities: normal Narrative Interpretation: no evidence of acute ischemia  ____________________________________________  RADIOLOGY  ED MD interpretation: CT of the head without contrast no evidence of acute abnormalities  Official radiology report(s): No results found.  ____________________________________________   PROCEDURES  Procedure(s) performed (including Critical Care):  .1-3 Lead EKG Interpretation Performed by: Naaman Plummer, MD Authorized by: Naaman Plummer, MD     Interpretation: normal     ECG rate:  75   ECG rate assessment: normal     Rhythm: sinus rhythm     Ectopy: none     Conduction: normal       ____________________________________________   INITIAL IMPRESSION / ASSESSMENT AND PLAN / ED COURSE  As part of my medical decision making, I reviewed the following data within the [place] notes reviewed and incorporated, Labs reviewed, EKG interpreted, Old chart reviewed, Radiograph reviewed and Notes from prior ED visits reviewed and incorporated        Patient is 80 year old  female with past medical history of Lewy body dementia who has been deteriorating in her ADLs over the last few weeks to the point where her husband cannot take care of her.  Laboratory evaluation does not show any evidence of acute red flag symptomatology.  Patient will board in our emergency department before seeing social work, physical therapy, Occupational Therapy and goal of eventual placement and likely a memory care facility.      ____________________________________________   FINAL CLINICAL IMPRESSION(S) / ED DIAGNOSES  Final diagnoses:  None     ED Discharge Orders    None       Note:  This document was prepared using Dragon voice recognition software and may include unintentional dictation errors.   Naaman Plummer, MD 07/15/20 [phone]

## 2020-07-12 NOTE — TOC Initial Note (Signed)
Transition of Care [facility]) - Initial/Assessment Note    Patient Details  Name: Lindsay Chung MRN: [id number] Date of Birth: 06/29/1940  Transition [facility]) CM/SW Contact:    Ova Freshwater Phone Number: [phone] 07/12/2020, 11:07 AM  Clinical Narrative:                  CSW received call from [facility] stating the patient's Lindsay Chung, Lindsay Chung (Spouse) [phone] contacted them with a request for hospice at home.  Lindsay Chung stated she will be in touch with Lindsay Chung today and update this CSW.        Patient Goals and CMS Choice        Expected Discharge Plan and Services                                                Prior Living Arrangements/Services                       Activities of Daily Living      Permission Sought/Granted                  Emotional Assessment              Admission diagnosis:  placement needed Patient Active Problem List   Diagnosis Date Noted  . Lewy body dementia with behavioral disturbance ([place]) 07/03/2020  . Hypokalemia 03/27/2019  . Anxiety 03/27/2019  . Dementia without behavioral disturbance ([place]) 03/27/2019  . Hypocalcemia 03/26/2019  . Hypomagnesemia   . Nausea without vomiting   . Poor appetite   . Diarrhea    PCP:  Lindsay Harrier, MD Pharmacy:   [place], [place] - [address] [place] [address] Phone: [phone] Fax: [phone]     Social Determinants of Health (SDOH) Interventions    Readmission Risk Interventions No flowsheet data found.

## 2020-07-12 NOTE — ED Notes (Signed)
VOL/SW placement pending

## 2020-07-12 NOTE — TOC Initial Note (Signed)
Transition of Care [facility]) - Initial/Assessment Note    Patient Details  Name: Lindsay Chung MRN: [id number] Date of Birth: [birth year]  Transition of Care [facility]) CM/SW Contact:    Ova Freshwater Phone Number: [phone] 07/12/2020, 5:10 PM  Clinical Narrative:                  Patient presents to [facility] ED due to inability to walk. CSW met with patient and Anella, Nakata (Spouse) [phone]. CSW explained role of TOC in patient care.  CSW explained the SNF and Home Health process and estimated timeline.  Mr. Nusz also asked about private care givers.  Mr. Privott stated he would like for the patient to go to SNF.  No [place].  FL2 sent out for placement, PASRR pending.   Barriers to Discharge: No Barriers Identified,SNF Pending bed offer   Patient Goals and CMS Choice        Expected Discharge Plan and Services                                                Prior Living Arrangements/Services                       Activities of Daily Living      Permission Sought/Granted                  Emotional Assessment              Admission diagnosis:  placement needed Patient Active Problem List   Diagnosis Date Noted  . Lewy body dementia with behavioral disturbance ([place]) 07/03/2020  . Hypokalemia 03/27/2019  . Anxiety 03/27/2019  . Dementia without behavioral disturbance ([place]) 03/27/2019  . Hypocalcemia 03/26/2019  . Hypomagnesemia   . Nausea without vomiting   . Poor appetite   . Diarrhea    PCP:  Tracie Harrier, MD Pharmacy:   [place], [place] - [address] [address] Phone: [phone] Fax: [phone]     Social Determinants of Health (SDOH) Interventions    Readmission Risk Interventions No flowsheet data found.

## 2020-07-12 NOTE — ED Notes (Signed)
PT and OT at bedside

## 2020-07-12 NOTE — ED Notes (Signed)
Pt has pressure ulcer stage 2 around bottom/anus area. Barrier cream applied and pt rolled to one side. Mepilex applied to upper bottom area-no redness present there at this time.

## 2020-07-12 NOTE — Evaluation (Signed)
Occupational Therapy Evaluation Patient Details Name: Lindsay Chung MRN: [id number] DOB: 02/18/1941 Today's Date: 07/12/2020    History of Present Illness Pt is a 80 y.o. female presenting to emergency department 4/12 with c/o weakness and difficulty walking.  PMH includes Lewy body dementia, anxiety, fibromyalgia, htn, IBS.   Clinical Impression   Pt seen for OT evaluation this date in setting of hospitalization d/t weakness and difficulty walking. Pt presents this date with decreased fxl activity tolerance and strength impacting her ability to safely contribute to ADLs/ADL mobility. Pt's spouse reports that, at baseline, pt is able to perform fxl mobility with RW and performs UB ADLs seated herself with just cues to initiate the task occasionally (can usually self-initiate familiar tasks like brushing teeth). This date, pt requires MIN/MOD A +2 for bed sup<>sit and sit<>stand. In addition, she requires at least MIN A to sustain static EOB sitting. For ADL performance, she requires MIN A as well as cues to initiate and sequence for UB ADLs and MAX A for LB ADLs including threading socks. Pt will likely require at least STR to improve strength and function to adequate level to safely contribute to ADLs/ADL mobility with family assistance at home. Will recommend SNF at this time to maximize pt outcomes and decreased caregiver burden through strengthening and improving fxl activity tolerance with therapy.     Follow Up Recommendations  SNF    Equipment Recommendations  Other (comment) (defer)    Recommendations for Other Services       Precautions / Restrictions Precautions Precautions: Fall Restrictions Weight Bearing Restrictions: No      Mobility Bed Mobility Overal bed mobility: Needs Assistance Bed Mobility: Supine to Sit;Sit to Supine     Supine to sit: Mod assist;Max assist;HOB elevated Sit to supine: Min assist;Mod assist;+2 for physical assistance;HOB elevated   General  bed mobility comments: assist for trunk and B LE's; vc's for technique; 2 assist to boost pt up in bed end of session using bed pad    Transfers Overall transfer level: Needs assistance Equipment used: 2 person hand held assist Transfers: Sit to/from Stand Sit to Stand: Min assist;+2 physical assistance         General transfer comment: assist to stand up and steady; vc's for technique    Balance Overall balance assessment: Needs assistance Sitting-balance support: No upper extremity supported;Feet supported Sitting balance-Leahy Scale: Poor Sitting balance - Comments: pt leaning to L initially in sitting requiring mod assist for upright but with cueing pt able to progress to close SBA although pt intermittently still leaning towards L side requiring cueing to correct and pillow placed to pt's L side for support in sitting Postural control: Left lateral lean   Standing balance-Leahy Scale: Poor Standing balance comment: UE support bilaterally                           ADL either performed or assessed with clinical judgement   ADL Overall ADL's : Needs assistance/impaired                                       General ADL Comments: MIN A for seated UB ADLs, MAX A for LB ADLs including threading socks. MIN A +2 for ADL transfers. MIN A for static sitting balnace EOB     Vision   Additional Comments: difficult to  formally assess  d/t cognition, pt appears to mostly track appropriately in the room     Perception     Praxis      Pertinent Vitals/Pain Pain Assessment: No/denies pain     Hand Dominance     Extremity/Trunk Assessment Upper Extremity Assessment Upper Extremity Assessment: Generalized weakness   Lower Extremity Assessment Lower Extremity Assessment: Generalized weakness   Cervical / Trunk Assessment Cervical / Trunk Assessment: Other exceptions Cervical / Trunk Exceptions: forward head/shoulders   Communication  Communication Communication: No difficulties   Cognition Arousal/Alertness: Awake/alert Behavior During Therapy: Flat affect (some occasional smile/appropraite emotion/affect) Overall Cognitive Status: History of cognitive impairments - at baseline                                 General Comments: Oriented to person, hospital in [place], and year (not month). follows ~90% of simple one step commands with increased processing time. Decreased awareness AEB consistently leaning and requiring tactile cues/correction, unaware and unable to self-correct.   General Comments       Exercises Other Exercises Other Exercises: OT ed pt's spouse re: role of OT.   Shoulder Instructions      Home Living Family/patient expects to be discharged to:: Private residence Living Arrangements: Spouse/significant other Available Help at Discharge: Family;Available 24 hours/day (Husband assisting pt typically but pt's daughter was helping past few days d/t pt's increased assist needs) Type of Home: Apartment (1st floor) Home Access: Level entry     Home Layout: One level     Bathroom Shower/Tub: Teacher, early years/pre: Standard     Home Equipment: Environmental consultant - 4 wheels;Shower seat (husband ordered w/c for pt (arriving end of the week))          Prior Functioning/Environment Level of Independence: Needs assistance  Gait / Transfers Assistance Needed: Pt was able to ambulate with walker about 2 weeks ago (but required some assist to stand from couch) but last 6-7 days pt unable to walk and family using rollator (pt sitting on rollator seat) to get around the home.  Pt's bed is higher and pt requires assist d/t this. ADL's / Homemaking Assistance Needed: Assist for medications, ADL's, IADL's, and bathing (pt used to be able to step into tub shower and sit in shower seat for bathing but most recently pt requiring sponge baths)   Comments: Husband goes to get groceries and do  errands        OT Problem List: Decreased strength;Decreased activity tolerance;Impaired balance (sitting and/or standing);Decreased coordination;Decreased cognition;Decreased safety awareness;Decreased knowledge of use of DME or AE;Decreased knowledge of precautions      OT Treatment/Interventions: Self-care/ADL training;DME and/or AE instruction;Therapeutic activities;Balance training;Therapeutic exercise;Energy conservation;Patient/family education    OT Goals(Current goals can be found in the care plan section) Acute Rehab OT Goals Patient Stated Goal: to improve mobility OT Goal Formulation: With family Time For Goal Achievement: 07/26/20 Potential to Achieve Goals: Fair ADL Goals Pt Will Perform Grooming: with min assist;sitting (with SETUP and MIN verbal cues to sequence as well as F static sitting balance with SBA.) Pt Will Perform Upper Body Dressing: with set-up;with supervision;sitting (with MIN cues to sequence) Pt Will Perform Lower Body Dressing: with min assist;with mod assist;sitting/lateral leans (sitting versus figure 4 in bed to don socks.) Pt Will Transfer to Toilet: with min guard assist;with min assist;ambulating;bedside commode (with LREAD to commode ~10' away to increase tolerance for fxl HH distances.)  OT Frequency: Min 1X/week   Barriers to D/C:            Co-evaluation PT/OT/SLP Co-Evaluation/Treatment: Yes Reason for Co-Treatment: Necessary to address cognition/behavior during functional activity;To address functional/ADL transfers;For patient/therapist safety PT goals addressed during session: Mobility/safety with mobility OT goals addressed during session: ADL's and self-care      AM-PAC OT "6 Clicks" Daily Activity     Outcome Measure Help from another person eating meals?: A Little Help from another person taking care of personal grooming?: A Little Help from another person toileting, which includes using toliet, bedpan, or urinal?: A Lot Help  from another person bathing (including washing, rinsing, drying)?: A Lot Help from another person to put on and taking off regular upper body clothing?: A Little Help from another person to put on and taking off regular lower body clothing?: A Lot 6 Click Score: 15   End of Session Equipment Utilized During Treatment: Gait belt Nurse Communication: Mobility status  Activity Tolerance: Patient tolerated treatment well Patient left: in bed;with call bell/phone within reach;with bed alarm set;with family/visitor present  OT Visit Diagnosis: Unsteadiness on feet (R26.81);Muscle weakness (generalized) (M62.81);Other symptoms and signs involving cognitive function                Time: 6256-3893 OT Time Calculation (min): 23 min Charges:  OT General Charges $OT Visit: 1 Visit OT Evaluation $OT Eval Moderate Complexity: [place], [place], OTR/L ascom [phone] 07/12/20, 5:35 PM

## 2020-07-12 NOTE — ED Notes (Signed)
Pt turned to other side with pillows to prop pt up. Resting at this time. Husband has stepped out. Fall alarm on.

## 2020-07-12 NOTE — ED Notes (Signed)
Patient refused to take shower or bed bath

## 2020-07-12 NOTE — Progress Notes (Signed)
PT Cancellation Note  Patient Details Name: SANIYYAH ELSTER MRN: [id number] DOB: 04-Oct-1940   Cancelled Treatment:    Reason Eval/Treat Not Completed: Patient declined, no reason specified.  PT consult received.  Chart reviewed.  Pt resting in bed with eyes closed upon PT arrival; pt opened her eyes easily with gentle vc's.  Therapist introduced herself and reason for therapy consult/visit but pt refusing to participate in any therapy activities because "I have other things to do at this time" but pt would not expand on these things or allow therapist to help her with these things--pt closed her eyes and kept resting in bed.  Unable to encourage pt to participate in therapy.  Nurse notified.  Will re-attempt PT evaluation at a later date/time.  Leitha Bleak, PT 07/12/20, 8:56 AM

## 2020-07-12 NOTE — Progress Notes (Signed)
[place] Room ED23 [facility]) Hospital Liaison RN note:  [facility] received a call and requests from patient's spouse, Barnabas Lister for hospice services at home. [facility] Liaison spoke with spouse and he stated that he was wanting help with taking care of her and some respite care. He stated that she was in the hospital now and was hoping that she could go to a facility. We discussed that she could have hospice at facility if that is what he wanted. He said that he would think about it. [facility] Liaison will follow for disposition and assist with referral as needed per family request. Adelene Amas, TOC is aware.  Thank you.  Zandra Abts, RN [facility] Liaison [phone]

## 2020-07-12 NOTE — ED Notes (Signed)
PT at bedside but pt refusing at this time stating "I have other things to do at this time"

## 2020-07-12 NOTE — ED Notes (Signed)
Pt thought she needed to have BM and was placed on bedpan but was unable to have a BM. Pt cleansed with wipes and no rinse cleanser. Pt given new purewick and brief.

## 2020-07-12 NOTE — ED Provider Notes (Signed)
Emergency Medicine Observation Re-evaluation Note  Lindsay Chung is a 80 y.o. female, seen on rounds today.  Pt initially presented to the ED for complaints of Weakness Currently, the patient is resting comfortably, no acute complaints.  Physical Exam  BP 125/70   Pulse 79   Temp 98.9 F (37.2 C)   Resp 16   Ht 5\' 3"  (1.6 m)   Wt 68 kg   SpO2 98%   BMI 26.56 kg/m  Physical Exam General: no acute distress Cardiac: normal rate Lungs: equal chest rise Psych: calm  ED Course / MDM  EKG:   I have reviewed the labs performed to date as well as medications administered while in observation.  Recent changes in the last 24 hours include none.  Plan  Current plan is for social work dispo. Patient is not under full IVC at this time.   Lucrezia Starch, MD 07/12/20 [phone]

## 2020-07-12 NOTE — ED Notes (Signed)
Pt husband at bedside to attempt to get pt to take meds. Pt refusing but eventually takes the spoon of applesauce into her own hand and eats it.

## 2020-07-12 NOTE — ED Notes (Signed)
Patient's spouse left for the evening. Patient resting comfortably.

## 2020-07-12 NOTE — Evaluation (Signed)
Physical Therapy Evaluation Patient Details Name: Lindsay Chung MRN: [id number] DOB: 1941-01-19 Today's Date: 07/12/2020   History of Present Illness  Pt is a 80 y.o. female presenting to emergency department 4/12 with c/o weakness and difficulty walking.  PMH includes Lewy body dementia, anxiety, fibromyalgia, htn, IBS.  Clinical Impression  PT/OT co-evaluation performed.  Prior to hospital admission, pt was ambulatory with walker about 2 weeks ago but has had increasing difficulty and increased assist levels required for mobility in last week; lives with her husband in 1st floor apt (level entry).  Currently pt is mod to max assist x1 semi-supine to sitting edge of bed; mod assist to SBA with sitting balance d/t intermittent L lean (vc's/assist required to correct); min assist x2 to stand up using B hand hold assist; min to mod assist x2 to side step to R along bed a few feet with B hand hold assist (assist to weight shift to L to advance R LE); and min to mod assist x2 sit to semi-supine in bed.  Pt would benefit from skilled PT to address noted impairments and functional limitations (see below for any additional details).  Upon hospital discharge, pt would benefit from SNF.    Follow Up Recommendations SNF    Equipment Recommendations  Rolling walker with 5" wheels;3in1 (PT);Wheelchair (measurements PT);Wheelchair cushion (measurements PT)    Recommendations for Other Services OT consult     Precautions / Restrictions Precautions Precautions: Fall Restrictions Weight Bearing Restrictions: No      Mobility  Bed Mobility Overal bed mobility: Needs Assistance Bed Mobility: Supine to Sit;Sit to Supine     Supine to sit: Mod assist;Max assist;HOB elevated Sit to supine: Min assist;Mod assist;+2 for physical assistance;HOB elevated   General bed mobility comments: assist for trunk and B LE's; vc's for technique; 2 assist to boost pt up in bed end of session using bed pad     Transfers Overall transfer level: Needs assistance Equipment used: 2 person hand held assist Transfers: Sit to/from Stand Sit to Stand: Min assist;+2 physical assistance         General transfer comment: assist to stand up and steady; vc's for technique  Ambulation/Gait Ambulation/Gait assistance: Min assist;Mod assist;+2 physical assistance Gait Distance (Feet):  (sidestepped to R along bed x3 feet) Assistive device: 2 person hand held assist   Gait velocity: decreased   General Gait Details: decreased weight shift towards L in order to advance R LE (more difficulty sidestepping with R LE d/t this) requiring assist and cueing for weight shift; decreased B LE step length and foot clearance; vc's for technique required  Stairs            Wheelchair Mobility    Modified Rankin (Stroke Patients Only)       Balance Overall balance assessment: Needs assistance Sitting-balance support: No upper extremity supported;Feet supported Sitting balance-Leahy Scale: Poor Sitting balance - Comments: pt leaning to L initially in sitting requiring mod assist for upright but with cueing pt able to progress to close SBA although pt intermittently still leaning towards L side requiring cueing to correct and pillow placed to pt's L side for support in sitting Postural control: Left lateral lean                                   Pertinent Vitals/Pain Pain Assessment: No/denies pain  Vitals (HR and O2 on room air) stable and [facility]  throughout treatment session.    Home Living Family/patient expects to be discharged to:: Private residence Living Arrangements: Spouse/significant other Available Help at Discharge: Family;Available 24 hours/day (Husband assisting pt typically but pt's daughter was helping past few days d/t pt's increased assist needs) Type of Home: Apartment (1st floor) Home Access: Level entry     Home Layout: One level Home Equipment: Walker - 4  wheels;Shower seat (husband ordered w/c for pt (arriving end of the week))      Prior Function Level of Independence: Needs assistance   Gait / Transfers Assistance Needed: Pt was able to ambulate with walker about 2 weeks ago (but required some assist to stand from couch) but last 6-7 days pt unable to walk and family using rollator (pt sitting on rollator seat) to get around the home.  Pt's bed is higher and pt requires assist d/t this.  ADL's / Homemaking Assistance Needed: Assist for medications, ADL's, IADL's, and bathing (pt used to be able to step into tub shower and sit in shower seat for bathing but most recently pt requiring sponge baths)  Comments: Husband goes to get groceries and do errands     Hand Dominance        Extremity/Trunk Assessment   Upper Extremity Assessment Upper Extremity Assessment: Defer to OT evaluation    Lower Extremity Assessment Lower Extremity Assessment: Generalized weakness (4/5 B hip flexion, knee flexion/extension, and DF/PF)    Cervical / Trunk Assessment Cervical / Trunk Assessment: Other exceptions Cervical / Trunk Exceptions: forward head/shoulders  Communication   Communication: No difficulties  Cognition Arousal/Alertness: Awake/alert Behavior During Therapy:  (pt appearing with more of a flat affect but does smile when asked to) Overall Cognitive Status: History of cognitive impairments - at baseline                                 General Comments: Oriented to person, hospital in [place], and year (not month)      General Comments   Nursing cleared pt for participation in physical therapy.  Pt agreeable to PT session.  Pt's husband present during session.    Exercises  Transfer and gait training   Assessment/Plan    PT Assessment Patient needs continued PT services  PT Problem List Decreased strength;Decreased activity tolerance;Decreased balance;Decreased mobility;Decreased cognition;Decreased knowledge  of use of DME;Decreased safety awareness;Decreased knowledge of precautions       PT Treatment Interventions DME instruction;Gait training;Functional mobility training;Therapeutic activities;Therapeutic exercise;Balance training;Patient/family education;Cognitive remediation    PT Goals (Current goals can be found in the Care Plan section)  Acute Rehab PT Goals Patient Stated Goal: to improve mobility PT Goal Formulation: With family Time For Goal Achievement: 07/26/20 Potential to Achieve Goals: Fair    Frequency Min 2X/week   Barriers to discharge Decreased caregiver support      Co-evaluation PT/OT/SLP Co-Evaluation/Treatment: Yes Reason for Co-Treatment: Necessary to address cognition/behavior during functional activity;For patient/therapist safety;To address functional/ADL transfers PT goals addressed during session: Mobility/safety with mobility;Balance OT goals addressed during session: ADL's and self-care       AM-PAC PT "6 Clicks" Mobility  Outcome Measure Help needed turning from your back to your side while in a flat bed without using bedrails?: A Little Help needed moving from lying on your back to sitting on the side of a flat bed without using bedrails?: A Lot Help needed moving to and from a bed to a chair (including  a wheelchair)?: A Lot Help needed standing up from a chair using your arms (e.g., wheelchair or bedside chair)?: A Lot Help needed to walk in hospital room?: Total Help needed climbing 3-5 steps with a railing? : Total 6 Click Score: 11    End of Session Equipment Utilized During Treatment: Gait belt Activity Tolerance: Patient tolerated treatment well Patient left: in bed;with call bell/phone within reach;with bed alarm set;with family/visitor present;Other (comment) (B heels floating via pillow support) Nurse Communication:  (Nurse busy in another pt's room that had just arrived to the ED) PT Visit Diagnosis: Unsteadiness on feet (R26.81);Other  abnormalities of gait and mobility (R26.89);Muscle weakness (generalized) (M62.81)    Time: 2481-8590 PT Time Calculation (min) (ACUTE ONLY): 23 min   Charges:   PT Evaluation $PT Eval Low Complexity: 1 Low PT Treatments $Therapeutic Activity: 8-22 mins       Leitha Bleak, PT 07/12/20, 2:19 PM

## 2020-07-12 NOTE — NC FL2 (Signed)
[place] LEVEL OF CARE SCREENING TOOL     IDENTIFICATION  Patient Name: Lindsay Chung Birthdate: [birth year] Sex: female Admission Date (Current Location): 07/11/2020  [facility] and [place] Number:  Engineering geologist and Address:  [facility], [address] [place], [place] [address]      Provider Number: [id number]  Attending Physician Name and Address:  No att. providers found  Relative Name and Phone Number:  Annajulia, Lewing (Spouse)   [phone]    Current Level of Care: Hospital Recommended Level of Care: [place] Prior Approval Number:    Date Approved/Denied:   PASRR Number: Pending  Discharge Plan: SNF    Current Diagnoses: Patient Active Problem List   Diagnosis Date Noted  . Lewy body dementia with behavioral disturbance ([place]) 07/03/2020  . Hypokalemia 03/27/2019  . Anxiety 03/27/2019  . Dementia without behavioral disturbance ([place]) 03/27/2019  . Hypocalcemia 03/26/2019  . Hypomagnesemia   . Nausea without vomiting   . Poor appetite   . Diarrhea     Orientation RESPIRATION BLADDER Height & Weight     Self,Place,Situation  Normal Incontinent Weight: 149 lb 14.6 oz (68 kg) Height:  5\' 3"  (160 cm)  BEHAVIORAL SYMPTOMS/MOOD NEUROLOGICAL BOWEL NUTRITION STATUS      Incontinent Diet  AMBULATORY STATUS COMMUNICATION OF NEEDS Skin   Limited Assist Verbally Normal                       Personal Care Assistance Level of Assistance  Bathing,Feeding,Dressing,Total care Bathing Assistance: Limited assistance Feeding assistance: Limited assistance Dressing Assistance: Limited assistance Total Care Assistance: Limited assistance   Functional Limitations Info  Sight,Hearing,Speech   Hearing Info: Adequate Speech Info: Adequate    SPECIAL CARE FACTORS FREQUENCY       PT 5X per week  OT 5X per week              Contractures      Additional Factors Info                   Current Medications (07/12/2020):  This is the current hospital active medication list Current Facility-Administered Medications  Medication Dose Route Frequency Provider Last Rate Last Admin  . ALPRAZolam Duanne Moron) tablet 0.5 mg  0.5 mg Oral TID Naaman Plummer, MD   0.5 mg at 07/12/20 1624  . aspirin EC tablet 81 mg  81 mg Oral Daily Naaman Plummer, MD   81 mg at 07/12/20 0941  . busPIRone (BUSPAR) tablet 15 mg  15 mg Oral TID Naaman Plummer, MD   15 mg at 07/12/20 1624  . cholecalciferol (VITAMIN D3) tablet 2,000 Units  2,000 Units Oral Daily Naaman Plummer, MD   2,000 Units at 07/12/20 0940  . loratadine (CLARITIN) tablet 10 mg  10 mg Oral Daily Naaman Plummer, MD   10 mg at 07/12/20 0940  . memantine (NAMENDA) tablet 5 mg  5 mg Oral BID Naaman Plummer, MD   5 mg at 07/12/20 0940  . metoprolol tartrate (LOPRESSOR) tablet 25 mg  25 mg Oral BID Naaman Plummer, MD   25 mg at 07/12/20 0940  . multivitamin with minerals tablet 1 tablet  1 tablet Oral Daily Naaman Plummer, MD   1 tablet at 07/12/20 0940  . pantoprazole (PROTONIX) EC tablet 40 mg  40 mg Oral Daily Naaman Plummer, MD   40 mg at 07/12/20 0941  .  pravastatin (PRAVACHOL) tablet 20 mg  20 mg Oral q1800 Naaman Plummer, MD      . risperiDONE (RISPERDAL) tablet 0.5 mg  0.5 mg Oral BID Naaman Plummer, MD   0.5 mg at 07/12/20 0941  . vitamin B-12 (CYANOCOBALAMIN) tablet 1,000 mcg  1,000 mcg Oral Daily Naaman Plummer, MD   1,000 mcg at 07/12/20 8466   Current Outpatient Medications  Medication Sig Dispense Refill  . ALPRAZolam (XANAX) 0.5 MG tablet Take 0.5 mg by mouth 3 (three) times daily.   1  . aspirin 81 MG tablet Take 81 mg by mouth daily.    . busPIRone (BUSPAR) 15 MG tablet Take 15 mg by mouth 3 (three) times daily.    . cetirizine (ZYRTEC) 10 MG tablet Take 10 mg by mouth daily.    . Cholecalciferol (VITAMIN D) 50 MCG (2000 UT) tablet Take 2,000 Units by mouth daily.     . fluvastatin (LESCOL) 40 MG capsule Take 40 mg  by mouth at bedtime.     . memantine (NAMENDA) 5 MG tablet Take 5 mg by mouth 2 (two) times daily.    . metoprolol tartrate (LOPRESSOR) 25 MG tablet Take 25 mg by mouth 2 (two) times daily.   3  . risperiDONE (RISPERDAL) 0.25 MG tablet Take 0.5 mg by mouth 2 (two) times daily.    . vitamin B-12 (CYANOCOBALAMIN) 1000 MCG tablet Take 1,000 mcg by mouth daily.    Marland Kitchen acetaminophen (TYLENOL) 500 MG tablet Take 1,000 mg by mouth every 6 (six) hours as needed for mild pain. (Patient not taking: Reported on 07/11/2020)    . calcium-vitamin D (OSCAL WITH D) 500-200 MG-UNIT tablet Take 1 tablet by mouth 2 (two) times daily. (Patient not taking: Reported on 07/11/2020) 60 tablet 0  . clobetasol cream (TEMOVATE) 5.99 % Apply 1 application topically as needed.    . donepezil (ARICEPT) 5 MG tablet Take 5 mg by mouth daily.  (Patient not taking: Reported on 07/11/2020)    . FLUoxetine (PROZAC) 20 MG capsule Take 1 capsule (20 mg total) by mouth 2 (two) times daily. (Patient taking differently: Take 60 mg by mouth daily.) 30 capsule 0  . Multiple Vitamin (MULTIVITAMIN) tablet Take 1 tablet by mouth daily.    Marland Kitchen omeprazole (PRILOSEC) 20 MG capsule Take 20 mg by mouth daily.  (Patient not taking: Reported on 07/11/2020)  3     Discharge Medications: Please see discharge summary for a list of discharge medications.  Relevant Imaging Results:  Relevant Lab Results:   Additional Information SS# [id number]  Adelene Amas, LCSWA

## 2020-07-12 NOTE — ED Notes (Signed)
Pt given meal tray and assisted to eat. Pt feeding self with set up needed.

## 2020-07-13 MED ORDER — HYDROXYZINE HCL 25 MG PO TABS
50.0000 mg | ORAL_TABLET | Freq: Once | ORAL | Status: AC
  Administered 2020-07-13: 50 mg via ORAL
  Filled 2020-07-13: qty 2

## 2020-07-13 NOTE — Progress Notes (Signed)
    Due to Lewy body's dementia, fibromyalgia, and IBS, patient requires frequent changes in body position and has an immediate need for a change in body position. The patient has a medical condition which requires positioning of the body in ways not feasible with an ordinary bed.

## 2020-07-13 NOTE — Progress Notes (Signed)
  Patient suffers from Lewy body's dementia, fibromyalgia, and general weakness which impairs their ability to perform daily activities bathing in the home. A cane will not resolve issue with performing activities of daily living. A wheelchair will allow patient to safely perform daily activities. Patient is not able to propel themselves in the home using a standard weight wheelchair due to general weakness.  Patient can self-propel in the lightweight wheelchair.

## 2020-07-13 NOTE — TOC Transition Note (Signed)
Transition of Care [facility]) - CM/SW Discharge Note   Patient Details  Name: Lindsay Chung MRN: [id number] Date of Birth: 26-May-1940  Transition of Care [facility]) CM/SW Contact:  Ova Freshwater Phone Number: [phone] 07/13/2020, 4:55 PM   Clinical Narrative:     Patient will d/c home with home health and DME.  Patient will receive DME: wheelchair, cushion, walker, 3 in 1, hospital bed and gel overlay from [employer], confirmed with [facility].  Patient will receive home health: PT, OT, RN and [place] with [facility] confirmed with [facility].  Patient's husband Cyniah Gossard refused all DME except the hospital bed.   EDP/ED staff aware.  Patient will return home in private vehicle.       Barriers to Discharge: No Barriers Identified,SNF Pending bed offer   Patient Goals and CMS Choice        Discharge Placement                       Discharge Plan and Services                                     Social Determinants of Health (SDOH) Interventions     Readmission Risk Interventions No flowsheet data found.

## 2020-07-13 NOTE — Progress Notes (Signed)
Physical Therapy Treatment Patient Details Name: Lindsay Chung MRN: [id number] DOB: October 24, 1940 Today's Date: 07/13/2020    History of Present Illness Pt is a 80 y.o. female presenting to emergency department 4/12 with c/o weakness and difficulty walking.  PMH includes Lewy body dementia, anxiety, fibromyalgia, htn, IBS.    PT Comments    Pt was pleasant and motivated to participate during the session.  Pt required physical assistance with all functional tasks this session but did make notable progress towards goals compared to prior session.  Pt required only +1 assist with functional tasks and was able to ambulate with a RW staying near the EOB for safety.  Pt reported no adverse symptoms during the session other than moderate HA but stated she did not need pain medication to address it.  Pt will benefit from PT services in a SNF setting upon discharge to safely address deficits listed in patient problem list for decreased caregiver assistance and eventual return to PLOF.     Follow Up Recommendations  SNF     Equipment Recommendations  Rolling walker with 5" wheels;3in1 (PT);Wheelchair (measurements PT);Wheelchair cushion (measurements PT)    Recommendations for Other Services       Precautions / Restrictions Precautions Precautions: Fall Restrictions Weight Bearing Restrictions: No    Mobility  Bed Mobility Overal bed mobility: Needs Assistance Bed Mobility: Supine to Sit;Sit to Supine     Supine to sit: Mod assist Sit to supine: Mod assist   General bed mobility comments: Mod A for BLE and trunk control    Transfers Overall transfer level: Needs assistance Equipment used: Rolling walker (2 wheeled) Transfers: Sit to/from Stand Sit to Stand: Mod assist         General transfer comment: Mod verbal and tactile cues for increased trunk flexion  Ambulation/Gait Ambulation/Gait assistance: Min assist Gait Distance (Feet): 5 Feet x 1, 3 Feet x 1 Assistive device:  Rolling walker (2 wheeled) Gait Pattern/deviations: Step-through pattern;Decreased step length - right;Decreased step length - left;Trunk flexed Gait velocity: decreased   General Gait Details: Pt able to take several steps forwards, backwards and sideways near the EOB for safety before becoming fatigued and needing to return to sitting   Stairs             Wheelchair Mobility    Modified Rankin (Stroke Patients Only)       Balance Overall balance assessment: Needs assistance Sitting-balance support: No upper extremity supported;Feet supported Sitting balance-Leahy Scale: Fair Sitting balance - Comments: Pt able to maintain static sitting position at the EOB with min posterior lean but without physical assistance for stability   Standing balance support: Bilateral upper extremity supported;During functional activity Standing balance-Leahy Scale: Poor Standing balance comment: Mod BUE support on the RW and occasional min A for stability                            Cognition Arousal/Alertness: Awake/alert Behavior During Therapy: Flat affect Overall Cognitive Status: History of cognitive impairments - at baseline                                        Exercises Total Joint Exercises Ankle Circles/Pumps: AROM;Both;Strengthening;10 reps Quad Sets: 10 reps;Both;Strengthening Gluteal Sets: Strengthening;Both;10 reps Heel Slides: AROM;AAROM;Right;Both;10 reps Long Arc Quad: AROM;Strengthening;Both;10 reps Knee Flexion: 10 reps;Both;Strengthening;AROM Marching in Standing: AROM;Strengthening;Both;5 reps;Standing Other Exercises  Other Exercises: Static standing at EOB 2 x 2 min for improved activity tolerance    General Comments        Pertinent Vitals/Pain Pain Assessment: 0-10 Pain Score: 5  Pain Location: HA Pain Descriptors / Indicators: Aching Pain Intervention(s): Monitored during session    Home Living                       Prior Function            PT Goals (current goals can now be found in the care plan section) Progress towards PT goals: Progressing toward goals    Frequency    Min 2X/week      PT Plan Current plan remains appropriate    Co-evaluation              AM-PAC PT "6 Clicks" Mobility   Outcome Measure  Help needed turning from your back to your side while in a flat bed without using bedrails?: A Little Help needed moving from lying on your back to sitting on the side of a flat bed without using bedrails?: A Lot Help needed moving to and from a bed to a chair (including a wheelchair)?: A Lot Help needed standing up from a chair using your arms (e.g., wheelchair or bedside chair)?: A Lot Help needed to walk in hospital room?: Total Help needed climbing 3-5 steps with a railing? : Total 6 Click Score: 11    End of Session Equipment Utilized During Treatment: Gait belt Activity Tolerance: Patient tolerated treatment well Patient left: in bed;with bed alarm set;with family/visitor present;Other (comment) Nurse Communication: Mobility status;Other (comment) (No call bell in room; per nursing psych rooms in ED do not have call bells) PT Visit Diagnosis: Unsteadiness on feet (R26.81);Other abnormalities of gait and mobility (R26.89);Muscle weakness (generalized) (M62.81)     Time: 4174-0814 PT Time Calculation (min) (ACUTE ONLY): 27 min  Charges:  $Gait Training: 8-22 mins $Therapeutic Exercise: 8-22 mins                    D. Scott Salsabeel Gorelick PT, DPT 07/13/20, 3:20 PM

## 2020-07-13 NOTE — ED Provider Notes (Signed)
Procedures     ----------------------------------------- 4:50 PM on 07/13/2020 ----------------------------------------- Patient suffers from Lewy body's dementia, fibromyalgia, and general weakness which impairs their ability to perform daily activities bathing in the home. A cane will not resolve issue with performing activities of daily living. A wheelchair will allow patient to safely perform daily activities. Patient is not able to propel themselves in the home using a standard weight wheelchair due to general weakness. Patient can self-propel in the lightweight wheelchair.   Due to Lewy body's dementia, fibromyalgia, and IBS, patient requires frequent changes in body position and has an immediate need for a change in body position. The patient has a medical condition which requires positioning of the body in ways not feasible with an ordinary bed.     Carrie Mew, MD 07/13/20 1650

## 2020-07-13 NOTE — ED Notes (Addendum)
Patient's brief checked - patient clean and dry. Patient placed in clean brief and repositioned. Bladder scan performed.

## 2020-07-13 NOTE — ED Notes (Signed)
Patient called out. RN to bedside. Patient attempting to escape from bed. Patient repositioned in bed. Traci NT now stationed outside of patient's room where she can maintain visual on patient.

## 2020-07-13 NOTE — ED Notes (Signed)
Patient is resting comfortably. Husband at bedside

## 2020-07-13 NOTE — ED Provider Notes (Signed)
-----------------------------------------   6:03 AM on 07/13/2020 -----------------------------------------   Blood pressure (!) 138/58, pulse 74, temperature 99.2 F (37.3 C), temperature source Rectal, resp. rate 17, height 5\' 3"  (1.6 m), weight 68 kg, SpO2 94 %.  The patient is calm and cooperative at this time.  There have been no acute events since the last update.  Awaiting disposition plan from Social Work team.   Paulette Blanch, MD 07/13/20 [phone]

## 2020-07-16 ENCOUNTER — Observation Stay: Payer: Medicare Other

## 2020-07-16 ENCOUNTER — Emergency Department: Payer: Medicare Other

## 2020-07-16 ENCOUNTER — Inpatient Hospital Stay
Admission: EM | Admit: 2020-07-16 | Discharge: 2020-07-21 | DRG: 871 | Disposition: A | Discharge status: Hospice | Payer: Medicare Other | Attending: Internal Medicine | Admitting: Internal Medicine

## 2020-07-16 ENCOUNTER — Other Ambulatory Visit: Payer: Self-pay

## 2020-07-16 ENCOUNTER — Encounter: Payer: Self-pay | Admitting: Emergency Medicine

## 2020-07-16 DIAGNOSIS — R6520 Severe sepsis without septic shock: Secondary | ICD-10-CM | POA: Diagnosis present

## 2020-07-16 DIAGNOSIS — A419 Sepsis, unspecified organism: Secondary | ICD-10-CM | POA: Diagnosis not present

## 2020-07-16 DIAGNOSIS — L89312 Pressure ulcer of right buttock, stage 2: Secondary | ICD-10-CM | POA: Diagnosis present

## 2020-07-16 DIAGNOSIS — Z87891 Personal history of nicotine dependence: Secondary | ICD-10-CM

## 2020-07-16 DIAGNOSIS — K219 Gastro-esophageal reflux disease without esophagitis: Secondary | ICD-10-CM | POA: Diagnosis present

## 2020-07-16 DIAGNOSIS — E785 Hyperlipidemia, unspecified: Secondary | ICD-10-CM | POA: Diagnosis present

## 2020-07-16 DIAGNOSIS — E876 Hypokalemia: Secondary | ICD-10-CM | POA: Diagnosis present

## 2020-07-16 DIAGNOSIS — J9601 Acute respiratory failure with hypoxia: Secondary | ICD-10-CM | POA: Diagnosis present

## 2020-07-16 DIAGNOSIS — Z515 Encounter for palliative care: Secondary | ICD-10-CM

## 2020-07-16 DIAGNOSIS — R531 Weakness: Secondary | ICD-10-CM

## 2020-07-16 DIAGNOSIS — F32A Depression, unspecified: Secondary | ICD-10-CM | POA: Diagnosis present

## 2020-07-16 DIAGNOSIS — F0280 Dementia in other diseases classified elsewhere without behavioral disturbance: Secondary | ICD-10-CM

## 2020-07-16 DIAGNOSIS — F0390 Unspecified dementia without behavioral disturbance: Secondary | ICD-10-CM | POA: Diagnosis present

## 2020-07-16 DIAGNOSIS — E871 Hypo-osmolality and hyponatremia: Secondary | ICD-10-CM | POA: Diagnosis present

## 2020-07-16 DIAGNOSIS — Z66 Do not resuscitate: Secondary | ICD-10-CM | POA: Diagnosis present

## 2020-07-16 DIAGNOSIS — I10 Essential (primary) hypertension: Secondary | ICD-10-CM | POA: Diagnosis present

## 2020-07-16 DIAGNOSIS — F419 Anxiety disorder, unspecified: Secondary | ICD-10-CM | POA: Diagnosis present

## 2020-07-16 DIAGNOSIS — F0281 Dementia in other diseases classified elsewhere with behavioral disturbance: Secondary | ICD-10-CM | POA: Diagnosis present

## 2020-07-16 DIAGNOSIS — G3183 Dementia with Lewy bodies: Secondary | ICD-10-CM | POA: Diagnosis present

## 2020-07-16 DIAGNOSIS — L89322 Pressure ulcer of left buttock, stage 2: Secondary | ICD-10-CM | POA: Diagnosis present

## 2020-07-16 DIAGNOSIS — Z20822 Contact with and (suspected) exposure to covid-19: Secondary | ICD-10-CM | POA: Diagnosis present

## 2020-07-16 DIAGNOSIS — J189 Pneumonia, unspecified organism: Secondary | ICD-10-CM | POA: Diagnosis present

## 2020-07-16 DIAGNOSIS — Z7982 Long term (current) use of aspirin: Secondary | ICD-10-CM

## 2020-07-16 DIAGNOSIS — G9341 Metabolic encephalopathy: Secondary | ICD-10-CM | POA: Diagnosis present

## 2020-07-16 DIAGNOSIS — M797 Fibromyalgia: Secondary | ICD-10-CM | POA: Diagnosis present

## 2020-07-16 DIAGNOSIS — R627 Adult failure to thrive: Secondary | ICD-10-CM | POA: Diagnosis present

## 2020-07-16 DIAGNOSIS — Z79899 Other long term (current) drug therapy: Secondary | ICD-10-CM

## 2020-07-16 LAB — RESP PANEL BY RT-PCR (FLU A&B, COVID) ARPGX2
Influenza A by PCR: NEGATIVE
Influenza B by PCR: NEGATIVE
SARS Coronavirus 2 by RT PCR: NEGATIVE

## 2020-07-16 LAB — COMPREHENSIVE METABOLIC PANEL
ALT: 65 U/L — ABNORMAL HIGH (ref 0–44)
AST: 179 U/L — ABNORMAL HIGH (ref 15–41)
Albumin: 3 g/dL — ABNORMAL LOW (ref 3.5–5.0)
Alkaline Phosphatase: 79 U/L (ref 38–126)
Anion gap: 11 (ref 5–15)
BUN: 24 mg/dL — ABNORMAL HIGH (ref 8–23)
CO2: 24 mmol/L (ref 22–32)
Calcium: 8.1 mg/dL — ABNORMAL LOW (ref 8.9–10.3)
Chloride: 97 mmol/L — ABNORMAL LOW (ref 98–111)
Creatinine, Ser: 1.1 mg/dL — ABNORMAL HIGH (ref 0.44–1.00)
GFR, Estimated: 51 mL/min — ABNORMAL LOW (ref >60)
Glucose, Bld: 110 mg/dL — ABNORMAL HIGH (ref 70–99)
Potassium: 3.7 mmol/L (ref 3.5–5.1)
Sodium: 132 mmol/L — ABNORMAL LOW (ref 135–145)
Total Bilirubin: 0.8 mg/dL (ref 0.3–1.2)
Total Protein: 6.3 g/dL — ABNORMAL LOW (ref 6.5–8.1)

## 2020-07-16 LAB — CBC WITH DIFFERENTIAL/PLATELET
Abs Immature Granulocytes: 0.08 10*3/uL — ABNORMAL HIGH (ref 0.00–0.07)
Basophils Absolute: 0 10*3/uL (ref 0.0–0.1)
Basophils Relative: 1 %
Eosinophils Absolute: 0 10*3/uL (ref 0.0–0.5)
Eosinophils Relative: 0 %
HCT: 36.6 % (ref 36.0–46.0)
Hemoglobin: 12.3 g/dL (ref 12.0–15.0)
Immature Granulocytes: 1 %
Lymphocytes Relative: 9 %
Lymphs Abs: 0.8 10*3/uL (ref 0.7–4.0)
MCH: 29.2 pg (ref 26.0–34.0)
MCHC: 33.6 g/dL (ref 30.0–36.0)
MCV: 86.9 fL (ref 80.0–100.0)
Monocytes Absolute: 0.7 10*3/uL (ref 0.1–1.0)
Monocytes Relative: 8 %
Neutro Abs: 6.5 10*3/uL (ref 1.7–7.7)
Neutrophils Relative %: 81 %
Platelets: 180 10*3/uL (ref 150–400)
RBC: 4.21 MIL/uL (ref 3.87–5.11)
RDW: 13.2 % (ref 11.5–15.5)
WBC: 8.1 10*3/uL (ref 4.0–10.5)
nRBC: 0 % (ref 0.0–0.2)

## 2020-07-16 LAB — URINALYSIS, COMPLETE (UACMP) WITH MICROSCOPIC
Bacteria, UA: NONE SEEN
Bilirubin Urine: NEGATIVE
Glucose, UA: NEGATIVE mg/dL
Hgb urine dipstick: NEGATIVE
Ketones, ur: 20 mg/dL — AB
Leukocytes,Ua: NEGATIVE
Nitrite: NEGATIVE
Protein, ur: 100 mg/dL — AB
Specific Gravity, Urine: 1.025 (ref 1.005–1.030)
pH: 6 (ref 5.0–8.0)

## 2020-07-16 LAB — PROTIME-INR
INR: 1.2 (ref 0.8–1.2)
Prothrombin Time: 15.2 seconds (ref 11.4–15.2)

## 2020-07-16 LAB — APTT: aPTT: 32 seconds (ref 24–36)

## 2020-07-16 LAB — TROPONIN I (HIGH SENSITIVITY)
Troponin I (High Sensitivity): 19 ng/L — ABNORMAL HIGH (ref <18)
Troponin I (High Sensitivity): 19 ng/L — ABNORMAL HIGH (ref <18)

## 2020-07-16 LAB — LACTIC ACID, PLASMA: Lactic Acid, Venous: 0.9 mmol/L (ref 0.5–1.9)

## 2020-07-16 LAB — PROCALCITONIN: Procalcitonin: 0.11 ng/mL

## 2020-07-16 MED ORDER — LACTATED RINGERS IV BOLUS (SEPSIS)
1000.0000 mL | Freq: Once | INTRAVENOUS | Status: AC
  Administered 2020-07-16: 1000 mL via INTRAVENOUS

## 2020-07-16 MED ORDER — METRONIDAZOLE IN NACL 5-0.79 MG/ML-% IV SOLN
500.0000 mg | Freq: Three times a day (TID) | INTRAVENOUS | Status: DC
Start: 2020-07-16 — End: 2020-07-18
  Administered 2020-07-16 – 2020-07-18 (×5): 500 mg via INTRAVENOUS
  Filled 2020-07-16 (×7): qty 100

## 2020-07-16 MED ORDER — VANCOMYCIN HCL IN DEXTROSE 1-5 GM/200ML-% IV SOLN: 1000.0000 mg | Freq: Once | INTRAVENOUS | Status: DC

## 2020-07-16 MED ORDER — VANCOMYCIN HCL 1000 MG/200ML IV SOLN
1000.0000 mg | INTRAVENOUS | Status: DC
  Filled 2020-07-16: qty 200

## 2020-07-16 MED ORDER — FLUOXETINE HCL 20 MG PO CAPS
20.0000 mg | ORAL_CAPSULE | Freq: Two times a day (BID) | ORAL | Status: DC
  Administered 2020-07-17 – 2020-07-21 (×9): 20 mg via ORAL
  Filled 2020-07-16 (×12): qty 1

## 2020-07-16 MED ORDER — SODIUM CHLORIDE 0.9 % IV SOLN
2.0000 g | Freq: Two times a day (BID) | INTRAVENOUS | Status: DC
  Administered 2020-07-16 – 2020-07-18 (×3): 2 g via INTRAVENOUS
  Filled 2020-07-16 (×5): qty 2

## 2020-07-16 MED ORDER — METOPROLOL TARTRATE 25 MG PO TABS
25.0000 mg | ORAL_TABLET | Freq: Two times a day (BID) | ORAL | Status: DC
  Administered 2020-07-17 – 2020-07-21 (×9): 25 mg via ORAL
  Filled 2020-07-16 (×9): qty 1

## 2020-07-16 MED ORDER — ASPIRIN 81 MG PO CHEW
81.0000 mg | CHEWABLE_TABLET | Freq: Every day | ORAL | Status: DC
  Administered 2020-07-17 – 2020-07-20 (×4): 81 mg via ORAL
  Filled 2020-07-16 (×4): qty 1

## 2020-07-16 MED ORDER — SODIUM CHLORIDE 0.9 % IV SOLN: INTRAVENOUS | Status: DC

## 2020-07-16 MED ORDER — PRAVASTATIN SODIUM 20 MG PO TABS
20.0000 mg | ORAL_TABLET | Freq: Every day | ORAL | Status: DC
  Administered 2020-07-17 – 2020-07-19 (×3): 20 mg via ORAL
  Filled 2020-07-16 (×4): qty 1

## 2020-07-16 MED ORDER — ONDANSETRON HCL 4 MG/2ML IJ SOLN: 4.0000 mg | Freq: Four times a day (QID) | INTRAMUSCULAR | Status: DC | PRN

## 2020-07-16 MED ORDER — VITAMIN B-12 1000 MCG PO TABS
1000.0000 ug | ORAL_TABLET | Freq: Every day | ORAL | Status: DC
  Administered 2020-07-17 – 2020-07-20 (×3): 1000 ug via ORAL
  Filled 2020-07-16 (×4): qty 1

## 2020-07-16 MED ORDER — ENOXAPARIN SODIUM 40 MG/0.4ML ~~LOC~~ SOLN
40.0000 mg | SUBCUTANEOUS | Status: DC
  Administered 2020-07-16 – 2020-07-19 (×4): 40 mg via SUBCUTANEOUS
  Filled 2020-07-16 (×4): qty 0.4

## 2020-07-16 MED ORDER — VANCOMYCIN HCL IN DEXTROSE 1-5 GM/200ML-% IV SOLN
1000.0000 mg | Freq: Once | INTRAVENOUS | Status: AC
  Administered 2020-07-16: 1000 mg via INTRAVENOUS
  Filled 2020-07-16: qty 200

## 2020-07-16 MED ORDER — SODIUM CHLORIDE 0.9 % IV SOLN
2.0000 g | Freq: Once | INTRAVENOUS | Status: AC
  Administered 2020-07-16: 2 g via INTRAVENOUS
  Filled 2020-07-16: qty 2

## 2020-07-16 MED ORDER — DONEPEZIL HCL 5 MG PO TABS
5.0000 mg | ORAL_TABLET | Freq: Every day | ORAL | Status: DC
  Administered 2020-07-17 – 2020-07-19 (×3): 5 mg via ORAL
  Filled 2020-07-16 (×3): qty 1

## 2020-07-16 MED ORDER — RISPERIDONE 0.5 MG PO TABS
0.5000 mg | ORAL_TABLET | Freq: Two times a day (BID) | ORAL | Status: DC
  Administered 2020-07-17 – 2020-07-21 (×9): 0.5 mg via ORAL
  Filled 2020-07-16 (×12): qty 1

## 2020-07-16 MED ORDER — METRONIDAZOLE IN NACL 5-0.79 MG/ML-% IV SOLN: 500.0000 mg | Freq: Once | INTRAVENOUS | Status: DC

## 2020-07-16 MED ORDER — ONDANSETRON HCL 4 MG PO TABS
4.0000 mg | ORAL_TABLET | Freq: Four times a day (QID) | ORAL | Status: DC | PRN
  Administered 2020-07-18: 4 mg via ORAL
  Filled 2020-07-16: qty 1

## 2020-07-16 MED ORDER — LACTATED RINGERS IV BOLUS (SEPSIS)
250.0000 mL | Freq: Once | INTRAVENOUS | Status: AC
  Administered 2020-07-16: 250 mL via INTRAVENOUS

## 2020-07-16 MED ORDER — VANCOMYCIN HCL 500 MG/100ML IV SOLN
500.0000 mg | Freq: Once | INTRAVENOUS | Status: AC
  Administered 2020-07-16: 500 mg via INTRAVENOUS
  Filled 2020-07-16: qty 100

## 2020-07-16 MED ORDER — ADULT MULTIVITAMIN W/MINERALS CH
1.0000 | ORAL_TABLET | Freq: Every day | ORAL | Status: DC
  Administered 2020-07-17 – 2020-07-20 (×4): 1 via ORAL
  Filled 2020-07-16 (×4): qty 1

## 2020-07-16 MED ORDER — BUSPIRONE HCL 10 MG PO TABS
15.0000 mg | ORAL_TABLET | Freq: Three times a day (TID) | ORAL | Status: DC
  Administered 2020-07-17 – 2020-07-21 (×14): 15 mg via ORAL
  Filled 2020-07-16 (×15): qty 2

## 2020-07-16 MED ORDER — ACETAMINOPHEN 650 MG RE SUPP: 650.0000 mg | Freq: Four times a day (QID) | RECTAL | Status: DC | PRN

## 2020-07-16 MED ORDER — ALPRAZOLAM 0.5 MG PO TABS
0.5000 mg | ORAL_TABLET | Freq: Three times a day (TID) | ORAL | Status: DC | PRN
  Administered 2020-07-19 – 2020-07-21 (×3): 0.5 mg via ORAL
  Filled 2020-07-16 (×3): qty 1

## 2020-07-16 MED ORDER — LORATADINE 10 MG PO TABS
10.0000 mg | ORAL_TABLET | Freq: Every day | ORAL | Status: DC
  Administered 2020-07-17 – 2020-07-20 (×4): 10 mg via ORAL
  Filled 2020-07-16 (×4): qty 1

## 2020-07-16 MED ORDER — MEMANTINE HCL 5 MG PO TABS
5.0000 mg | ORAL_TABLET | Freq: Two times a day (BID) | ORAL | Status: DC
  Administered 2020-07-17 – 2020-07-20 (×7): 5 mg via ORAL
  Filled 2020-07-16 (×7): qty 1

## 2020-07-16 MED ORDER — IOHEXOL 350 MG/ML SOLN
75.0000 mL | Freq: Once | INTRAVENOUS | Status: AC | PRN
  Administered 2020-07-16: 75 mL via INTRAVENOUS

## 2020-07-16 MED ORDER — ACETAMINOPHEN 325 MG PO TABS
650.0000 mg | ORAL_TABLET | Freq: Four times a day (QID) | ORAL | Status: DC | PRN
  Administered 2020-07-18 – 2020-07-21 (×3): 650 mg via ORAL
  Filled 2020-07-16 (×3): qty 2

## 2020-07-16 MED ORDER — DM-GUAIFENESIN ER 30-600 MG PO TB12
1.0000 | ORAL_TABLET | Freq: Two times a day (BID) | ORAL | Status: DC
  Administered 2020-07-17 – 2020-07-21 (×7): 1 via ORAL
  Filled 2020-07-16 (×12): qty 1

## 2020-07-16 MED ORDER — SODIUM CHLORIDE 0.9 % IV SOLN: 2.0000 g | Freq: Once | INTRAVENOUS | Status: DC

## 2020-07-16 MED ORDER — METRONIDAZOLE IN NACL 5-0.79 MG/ML-% IV SOLN
500.0000 mg | Freq: Once | INTRAVENOUS | Status: AC
  Administered 2020-07-16: 500 mg via INTRAVENOUS
  Filled 2020-07-16: qty 100

## 2020-07-16 NOTE — ED Notes (Signed)
Pt hypoxic at 89% on RA. Pt placed on 2L [place]. O2 increased to 97%.

## 2020-07-16 NOTE — ED Notes (Signed)
Pt soiled with urine at this time. Pt had a full linen change. Gown, brief, pad, and sheets changed at this time.

## 2020-07-16 NOTE — Consult Note (Signed)
Pharmacy Antibiotic Note  Lindsay Chung is a 80 y.o. female admitted on 07/16/2020 with sepsis.  Pharmacy has been consulted for vanc & cefepime dosing. Pt received load of 1500mg  of vanc and 2gm cefepime.  Plan: Order cefepime 2gm q12hrs Order vanc 1gm q24hrs  Height: 5\' 3"  (160 cm) Weight: 68 kg (149 lb 14.6 oz) IBW/kg (Calculated) : 52.4  Temp (24hrs), Avg:99.7 F (37.6 C), Min:97.9 F (36.6 C), Max:101.1 F (38.4 C)  Recent Labs  Lab 07/11/20 1439 07/16/20 1212  WBC 6.4 8.1  CREATININE 0.94 1.10*  LATICACIDVEN  --  0.9    Estimated Creatinine Clearance: 38.4 mL/min (A) (by C-G formula based on SCr of 1.1 mg/dL (H)).    Allergies  Allergen Reactions  . Amoxicillin Other (See Comments)    Patient states that she can take this medication.     Antimicrobials this admission: 0417cefepime >>  4/17 vancomycin >>   Dose adjustments this admission: none  Microbiology results: 417 BCx: pending 417 UCx: pending     Thank you for allowing pharmacy to be a part of this patient's care.  Berta Minor 07/16/2020 10:33 PM

## 2020-07-16 NOTE — ED Triage Notes (Signed)
Pt via EMS from home. Per EMS, pt is being sent over for lethargy. Pt was recently d/c and looking for a rehab facility. Pt has been non-ambulatory recently. Pt was recently d/c from this facility. On arrival, pt is lethargic but responsive to voice. Pt has a hx of lewy body dementia.

## 2020-07-16 NOTE — ED Provider Notes (Signed)
[facility] Emergency Department Provider Note   ____________________________________________   Event Date/Time   First MD Initiated Contact with Patient 07/16/20 1201     (approximate)  I have reviewed the triage vital signs and the nursing notes.   HISTORY  Chief Complaint Weakness    HPI Lindsay Chung is a 80 y.o. female with past medical history of Lewy body dementia, fibromyalgia, and hyperlipidemia who presents to the ED for weakness.  Per EMS, patient has been increasingly weak over the past couple of days to the point that she has been unable to walk.  She deals with some confusion at baseline, but daughter reported that patient was confused beyond this at home.  EMS noted patient to have a temperature of 101.4 axillary, she was subsequently given small bolus of IV fluids.  Patient states that "I do not feel well" but does not voice any specific complaints.  She denies any cough, chest pain, shortness of breath, abdominal pain, nausea, vomiting, diarrhea, dysuria, or hematuria.  Family has recently been seeking SNF placement due to Lewy body dementia and generalized weakness.        Past Medical History:  Diagnosis Date  . Anxiety   . Fibromyalgia   . GERD (gastroesophageal reflux disease)   . Hyperlipidemia   . Hypertension   . Irritable bowel syndrome   . Lewy body dementia ([place])   . Memory difficulty     Patient Active Problem List   Diagnosis Date Noted  . Lewy body dementia with behavioral disturbance ([place]) 07/03/2020  . Hypokalemia 03/27/2019  . Anxiety 03/27/2019  . Dementia without behavioral disturbance ([place]) 03/27/2019  . Hypocalcemia 03/26/2019  . Hypomagnesemia   . Nausea without vomiting   . Poor appetite   . Diarrhea     Past Surgical History:  Procedure Laterality Date  . BREAST BIOPSY Left    neg  . BREAST BIOPSY Left 06/16/2018   affirm stereo biopsy/ x clip/ path pending  . CATARACT EXTRACTION W/PHACO Left  02/21/2020   Procedure: CATARACT EXTRACTION PHACO AND INTRAOCULAR LENS PLACEMENT ([place]) LEFT;  Surgeon: Eulogio Bear, MD;  Location: [place];  Service: Ophthalmology;  Laterality: Left;  6.37 0:40.0  . CATARACT EXTRACTION W/PHACO Right 03/13/2020   Procedure: CATARACT EXTRACTION PHACO AND INTRAOCULAR LENS PLACEMENT ([place]) RIGHT;  Surgeon: Eulogio Bear, MD;  Location: [place];  Service: Ophthalmology;  Laterality: Right;  6.72 0:42.2  . COLONOSCOPY W/ POLYPECTOMY    . COLONOSCOPY WITH PROPOFOL N/A 10/21/2014   Procedure: COLONOSCOPY WITH PROPOFOL;  Surgeon: Manya Silvas, MD;  Location: [facility] ENDOSCOPY;  Service: Endoscopy;  Laterality: N/A;  . ESOPHAGOGASTRODUODENOSCOPY    . GYNECOLOGIC CRYOSURGERY    . TUBAL LIGATION      Prior to Admission medications   Medication Sig Start Date End Date Taking? Authorizing Provider  acetaminophen (TYLENOL) 500 MG tablet Take 1,000 mg by mouth every 6 (six) hours as needed for mild pain. Patient not taking: Reported on 07/11/2020    [provider]  ALPRAZolam Duanne Moron) 0.5 MG tablet Take 0.5 mg by mouth 3 (three) times daily.  08/10/14   [provider]  aspirin 81 MG tablet Take 81 mg by mouth daily.    [provider]  busPIRone (BUSPAR) 15 MG tablet Take 15 mg by mouth 3 (three) times daily.    [provider]  calcium-vitamin D (OSCAL WITH D) 500-200 MG-UNIT tablet Take 1 tablet by mouth 2 (two)  times daily. Patient not taking: Reported on 07/11/2020 03/28/19   Thornell Mule, MD  cetirizine (ZYRTEC) 10 MG tablet Take 10 mg by mouth daily.    [provider]  Cholecalciferol (VITAMIN D) 50 MCG (2000 UT) tablet Take 2,000 Units by mouth daily.     [provider]  clobetasol cream (TEMOVATE) 2.68 % Apply 1 application topically as needed.    [provider]  donepezil (ARICEPT) 5 MG tablet Take 5 mg by mouth daily.  Patient not taking: Reported on 07/11/2020  03/17/19   [provider]  FLUoxetine (PROZAC) 20 MG capsule Take 1 capsule (20 mg total) by mouth 2 (two) times daily. Patient taking differently: Take 60 mg by mouth daily. 03/28/19   Thornell Mule, MD  fluvastatin (LESCOL) 40 MG capsule Take 40 mg by mouth at bedtime.     [provider]  memantine (NAMENDA) 5 MG tablet Take 5 mg by mouth 2 (two) times daily.    [provider]  metoprolol tartrate (LOPRESSOR) 25 MG tablet Take 25 mg by mouth 2 (two) times daily.     [provider]  Multiple Vitamin (MULTIVITAMIN) tablet Take 1 tablet by mouth daily.    [provider]  omeprazole (PRILOSEC) 20 MG capsule Take 20 mg by mouth daily.  Patient not taking: Reported on 07/11/2020 06/12/14   [provider]  risperiDONE (RISPERDAL) 0.25 MG tablet Take 0.5 mg by mouth 2 (two) times daily.    [provider]  vitamin B-12 (CYANOCOBALAMIN) 1000 MCG tablet Take 1,000 mcg by mouth daily.    [provider]    Allergies Amoxicillin  Family History  Problem Relation Age of Onset  . Breast cancer Neg Hx     Social History Social History   Tobacco Use  . Smoking status: Former Research scientist (life sciences)  . Smokeless tobacco: Never Used  . Tobacco comment: "quit years ago"  Vaping Use  . Vaping Use: Never used  Substance Use Topics  . Alcohol use: Not Currently  . Drug use: Not Currently    Review of Systems  Constitutional: Positive for fever and malaise. Eyes: No visual changes. ENT: No sore throat. Cardiovascular: Denies chest pain. Respiratory: Denies shortness of breath. Gastrointestinal: No abdominal pain.  No nausea, no vomiting.  No diarrhea.  No constipation. Genitourinary: Negative for dysuria. Musculoskeletal: Negative for back pain. Skin: Negative for rash. Neurological: Negative for headaches, focal weakness or numbness.  ____________________________________________   PHYSICAL EXAM:  VITAL SIGNS: ED Triage  Vitals  Enc Vitals Group     BP      Pulse      Resp      Temp      Temp src      SpO2      Weight      Height      Head Circumference      Peak Flow      Pain Score      Pain Loc      Pain Edu?      Excl. in [place]?     Constitutional: Somnolent but arousable to voice, oriented to person and place, but not time. Eyes: Conjunctivae are normal. Head: Atraumatic. Nose: No congestion/rhinnorhea. Mouth/Throat: Mucous membranes are dry. Neck: Normal ROM Cardiovascular: Tachycardic, regular rhythm. Grossly normal heart sounds. Respiratory: Normal respiratory effort.  No retractions. Lungs CTAB. Gastrointestinal: Soft and nontender. No distention. Genitourinary: deferred Musculoskeletal: No lower extremity tenderness nor edema. Neurologic:  Normal speech and language.  Globally weak with no gross focal neurologic deficits appreciated. Skin:  Skin is warm, dry and intact. No rash noted. Psychiatric: Mood and affect are normal. Speech and behavior are normal.  ____________________________________________   LABS (all labs ordered are listed, but only abnormal results are displayed)  Labs Reviewed  COMPREHENSIVE METABOLIC PANEL - Abnormal; Notable for the following components:      Result Value   Sodium 132 (*)    Chloride 97 (*)    Glucose, Bld 110 (*)    BUN 24 (*)    Creatinine, Ser 1.10 (*)    Calcium 8.1 (*)    Total Protein 6.3 (*)    Albumin 3.0 (*)    AST 179 (*)    ALT 65 (*)    GFR, Estimated 51 (*)    All other components within normal limits  CBC WITH DIFFERENTIAL/PLATELET - Abnormal; Notable for the following components:   Abs Immature Granulocytes 0.08 (*)    All other components within normal limits  URINALYSIS, COMPLETE (UACMP) WITH MICROSCOPIC - Abnormal; Notable for the following components:   Color, Urine YELLOW (*)    APPearance HAZY (*)    Ketones, ur 20 (*)    Protein, ur 100 (*)    All other components within normal limits  TROPONIN I (HIGH  SENSITIVITY) - Abnormal; Notable for the following components:   Troponin I (High Sensitivity) 19 (*)    All other components within normal limits  TROPONIN I (HIGH SENSITIVITY) - Abnormal; Notable for the following components:   Troponin I (High Sensitivity) 19 (*)    All other components within normal limits  RESP PANEL BY RT-PCR (FLU A&B, COVID) ARPGX2  CULTURE, BLOOD (ROUTINE X 2)  CULTURE, BLOOD (ROUTINE X 2)  URINE CULTURE  LACTIC ACID, PLASMA  PROTIME-INR  APTT  PROCALCITONIN   ____________________________________________  EKG  ED ECG REPORT I, Blake Divine, the attending physician, personally viewed and interpreted this ECG.   Date: 07/16/2020  EKG Time: 12:04  Rate: 80  Rhythm: normal sinus rhythm  Axis: LAD  Intervals:Prolonged QT  ST&T Change: None   PROCEDURES  Procedure(s) performed (including Critical Care):  .Critical Care Performed by: Blake Divine, MD Authorized by: Blake Divine, MD   Critical care provider statement:    Critical care time (minutes):  45   Critical care time was exclusive of:  Separately billable procedures and treating other patients and teaching time   Critical care was necessary to treat or prevent imminent or life-threatening deterioration of the following conditions:  Sepsis   Critical care was time spent personally by me on the following activities:  Discussions with consultants, evaluation of patient's response to treatment, examination of patient, ordering and performing treatments and interventions, ordering and review of laboratory studies, ordering and review of radiographic studies, pulse oximetry, re-evaluation of patient's condition, obtaining history from patient or surrogate and review of old charts   I assumed direction of critical care for this patient from another provider in my specialty: no     Care discussed with: admitting provider       ____________________________________________   INITIAL  IMPRESSION / [place] / ED COURSE       80 year old female with past medical history of Lewy body dementia, fibromyalgia, and hyperlipidemia who presents to the ED for increasing weakness and confusion beyond her baseline, noted to be febrile by EMS.  Presentation concerning for sepsis and we will further assess with blood cultures, lactate, chest x-ray, and  UA.  We will hydrate with IV fluids, no localizing symptoms to help identify source at this time.  Patient has no focal neurologic deficits on exam and I doubt stroke.  CT head is negative for acute process, chest x-ray shows no infiltrate, edema, or effusion.  Labs are unremarkable and UA shows no signs of infection.  We will further assess with CT scan of abdomen/pelvis for potential source of her sepsis.  Patient remains hemodynamically stable at this time.  Patient turned over to oncoming provider pending CT results, after which she will require admission for sepsis.      ____________________________________________   FINAL CLINICAL IMPRESSION(S) / ED DIAGNOSES  Final diagnoses:  Sepsis without acute organ dysfunction, due to unspecified organism [facility])     ED Discharge Orders    None       Note:  This document was prepared using Dragon voice recognition software and may include unintentional dictation errors.   Blake Divine, MD 07/16/20 1536

## 2020-07-16 NOTE — Progress Notes (Signed)
[facility] monitoring for sepsis protocol.

## 2020-07-16 NOTE — Consult Note (Signed)
PHARMACY -  BRIEF ANTIBIOTIC NOTE   Pharmacy has received consult(s) for vancomycin and cefepime from an ED provider.  The patient's profile has been reviewed for ht/wt/allergies/indication/available labs.    One time order(s) placed for vancomycin 1g and cefepime 2g per EDP  Further antibiotics/pharmacy consults should be ordered by admitting physician if indicated.                       Thank you,  Benn Moulder, PharmD Pharmacy Resident  07/16/2020 12:23 PM

## 2020-07-16 NOTE — ED Notes (Signed)
Patient transported to MRI 

## 2020-07-16 NOTE — Consult Note (Signed)
CODE SEPSIS - PHARMACY COMMUNICATION  **Broad Spectrum Antibiotics should be administered within 1 hour of Sepsis diagnosis**  Time Code Sepsis Called/Page Received: 1204  Antibiotics Ordered: vancomycin and cefepime  Time of 1st antibiotic administration: 1237  Additional action taken by pharmacy: N/A  If necessary, Name of Provider/Nurse Contacted: N/A  Benn Moulder, PharmD Pharmacy Resident  07/16/2020 12:39 PM

## 2020-07-16 NOTE — ED Notes (Signed)
Patient transported to CT 

## 2020-07-16 NOTE — H&P (Signed)
History and Physical   Lindsay Chung EPP:[id number] DOB: 24-May-1940 DOA: 07/16/2020  PCP: Tracie Harrier, MD  Patient coming from: Home via EMS  I have personally briefly reviewed patient's old medical records in [place].  Chief Concern: Lethargy and weakness  HPI: Lindsay Chung is a 80 y.o. female with medical history significant for Lewy body dementia, hyperlipidemia, depression, psychiatric imbalance, anxiety, GERD, hypertension, presents to the emergency department for chief concerns of lethargy.  Husband at bedside states that patient had fever at home, however T-max was not known as husband who is at bedside was at church, and this report was per daughter.  Spouse at bedside reports that family can no longer take care of patient and with previous hospitalization, family was looking for a long-term facility for patient however there was no availability anywhere or patient was on excepted anywhere in the only place that accepted patient was not highly recommended and not acceptable to family.  Per spouse, "I am not just going to throw her into a hole and leave her there ".  We want her to be able to go to a place that will look after her well.  At bedside, patient was able to tell me her name, mumbled her age of 80.  She does not know she is in the hospital.  She does open her eyes spontaneously and will talk with loud verbal stimuli.  Social history: lives at home with spouse and has help from daughter. She is a former tobacco user, quit about 1 year ago. She used to smoke 2-3 cigarettes per day. She does not drink etoh.  Vaccinations: 3 shots for covid 19  ROS: Unable to complete as patient has advanced dementia  ED Course: Discussed with emergency medicine provider weakness.  Assessment/Plan  Active Problems:   Anxiety   Dementia without behavioral disturbance (HCC)   Lewy body dementia with behavioral disturbance (HCC)   Weakness   Weakness Care dependent   Lethargy -Social admission -Family is expressing desire for placement -TOC, PT, OT ordered -CT of the head per ED provider was negative for no focal acute intracranial abnormality -MRI of the brain ordered: Read as no evidence of acute intracranial abnormality.  Redemonstrated chronic lacunar infarcts within the right caudate head, bilateral thalami and bilateral cerebellar hemispheres  Mild elevation of serum creatinine -Serum creatinine on presentation is 1.10/eGFR 51, baseline is 0.71-0.94 -Baseline serum creatinine is greater than 60 -Status post lactated ringer 1.25 IV per ED provider -Normal saline 125 ml/per hour, 1 day ordered  Sepsis not excluded Fever, T-max of 101 -Etiology of fever work-up is in progress -Heart rate greater than 100, fever, hypoxia -Checking pro-Cal, blood cultures, urine culture -UA was negative for leukocytes and nitrates  Behavioral disturbance-resumed risperidone 0.5 mg twice daily  Dementia-memantine 5 mg p.o. twice daily, donepezil 5 mg p.o. nightly  Anxiety/depression-buspirone 15 mg p.o. 3 times daily, fluoxetine 20 mg twice daily resumed -Xanax 0.5 mg p.o. 3 times daily, as needed for anxiety resumed  Hypertension-metoprolol tartrate 25 mg twice daily resumed  Hyperlipidemia-pravastatin 20 mg daily at 1800 resumed  Concerning for aspiration-n.p.o. except for sips of meds -SLP ordered to assess for swallow study  Chart reviewed.   DVT prophylaxis: Enoxaparin 40 mg subcutaneous every 24 hours Code Status: DNR Diet: N.p.o. except for sips with meds and ice chips, pending swallow study Family Communication: Updated spouse at bedside Disposition Plan: Pending clinical course, SLP, TOC, PT, OT Consults called: Social consults Admission  status: Observation, MedSurg, with telemetry  Past Medical History:  Diagnosis Date  . Anxiety   . Fibromyalgia   . GERD (gastroesophageal reflux disease)   . Hyperlipidemia   . Hypertension   .  Irritable bowel syndrome   . Lewy body dementia ([place])   . Memory difficulty    Past Surgical History:  Procedure Laterality Date  . BREAST BIOPSY Left    neg  . BREAST BIOPSY Left 06/16/2018   affirm stereo biopsy/ x clip/ path pending  . CATARACT EXTRACTION W/PHACO Left 02/21/2020   Procedure: CATARACT EXTRACTION PHACO AND INTRAOCULAR LENS PLACEMENT ([place]) LEFT;  Surgeon: Eulogio Bear, MD;  Location: [place];  Service: Ophthalmology;  Laterality: Left;  6.37 0:40.0  . CATARACT EXTRACTION W/PHACO Right 03/13/2020   Procedure: CATARACT EXTRACTION PHACO AND INTRAOCULAR LENS PLACEMENT ([place]) RIGHT;  Surgeon: Eulogio Bear, MD;  Location: [place];  Service: Ophthalmology;  Laterality: Right;  6.72 0:42.2  . COLONOSCOPY W/ POLYPECTOMY    . COLONOSCOPY WITH PROPOFOL N/A 10/21/2014   Procedure: COLONOSCOPY WITH PROPOFOL;  Surgeon: Manya Silvas, MD;  Location[facility] ENDOSCOPY;  Service: Endoscopy;  Laterality: N/A;  . ESOPHAGOGASTRODUODENOSCOPY    . GYNECOLOGIC CRYOSURGERY    . TUBAL LIGATION     Social History:  reports that she has quit smoking. She has never used smokeless tobacco. She reports previous alcohol use. She reports previous drug use.  Allergies  Allergen Reactions  . Amoxicillin Other (See Comments)    Patient states that she can take this medication.    Family History  Problem Relation Age of Onset  . Breast cancer Neg Hx    Family history: Family history reviewed and not pertinent  Prior to Admission medications   Medication Sig Start Date End Date Taking? Authorizing Provider  acetaminophen (TYLENOL) 500 MG tablet Take 1,000 mg by mouth every 6 (six) hours as needed for mild pain. Patient not taking: Reported on 07/11/2020    [provider]  ALPRAZolam Duanne Moron) 0.5 MG tablet Take 0.5 mg by mouth 3 (three) times daily.  08/10/14   [provider]  aspirin 81 MG tablet Take 81 mg by mouth daily.    [provider]  busPIRone (BUSPAR) 15 MG tablet Take 15 mg by mouth 3 (three) times daily.    [provider]  calcium-vitamin D (OSCAL WITH D) 500-200 MG-UNIT tablet Take 1 tablet by mouth 2 (two) times daily. Patient not taking: Reported on 07/11/2020 03/28/19   Thornell Mule, MD  cetirizine (ZYRTEC) 10 MG tablet Take 10 mg by mouth daily.    [provider]  Cholecalciferol (VITAMIN D) 50 MCG (2000 UT) tablet Take 2,000 Units by mouth daily.     [provider]  clobetasol cream (TEMOVATE) 4.59 % Apply 1 application topically as needed.    [provider]  donepezil (ARICEPT) 5 MG tablet Take 5 mg by mouth daily.  Patient not taking: Reported on 07/11/2020 03/17/19   [provider]  FLUoxetine (PROZAC) 20 MG capsule Take 1 capsule (20 mg total) by mouth 2 (two) times daily. Patient taking differently: Take 60 mg by mouth daily. 03/28/19   Thornell Mule, MD  fluvastatin (LESCOL) 40 MG capsule Take 40 mg by mouth at bedtime.     [provider]  memantine (NAMENDA) 5 MG tablet Take 5 mg by mouth 2 (two) times daily.    [provider]  metoprolol tartrate (LOPRESSOR) 25 MG tablet Take 25  mg by mouth 2 (two) times daily.     [provider]  Multiple Vitamin (MULTIVITAMIN) tablet Take 1 tablet by mouth daily.    [provider]  omeprazole (PRILOSEC) 20 MG capsule Take 20 mg by mouth daily.  Patient not taking: Reported on 07/11/2020 06/12/14   [provider]  risperiDONE (RISPERDAL) 0.25 MG tablet Take 0.5 mg by mouth 2 (two) times daily.    [provider]  vitamin B-12 (CYANOCOBALAMIN) 1000 MCG tablet Take 1,000 mcg by mouth daily.    [provider]   Physical Exam: Vitals:   07/16/20 1645 07/16/20 1700 07/16/20 1800 07/16/20 1934  BP:  140/76 131/67 128/63  Pulse: 87 88 80 81  Resp: 20 (!) _0 Temp:   (!) 100.4 F (38 C) 97.9 F (36.6 C)  TempSrc:   Axillary Oral  SpO2:  94% 95% 94% 99%  Weight:      Height:       Constitutional: appears age-appropriate, NAD, calm, comfortable Eyes: PERRL, lids and conjunctivae normal ENMT: Mucous membranes are moist. Posterior pharynx clear of any exudate or lesions. Age-appropriate dentition. Hearing appropriate Neck: normal, supple, no masses, no thyromegaly Respiratory: clear to auscultation bilaterally, no wheezing, no crackles. Normal respiratory effort. No accessory muscle use.  Cardiovascular: Regular rate and rhythm, no murmurs / rubs / gallops. No extremity edema. 2+ pedal pulses. No carotid bruits.  Abdomen: no tenderness, soft, no masses palpated, no hepatosplenomegaly. Bowel sounds positive.  Musculoskeletal: no clubbing / cyanosis. No joint deformity upper and lower extremities. Good ROM, no contractures, no atrophy. Normal muscle tone.  Skin: no rashes, lesions, ulcers. No induration Neurologic: Sensation intact. Strength 5/5 in all 4.  Psychiatric: Normal judgment and insight. Alert and oriented x self. Normal mood.   EKG: independently reviewed, showing normal sinus rhythm, rate of 80, QTc 500  Chest x-ray on Admission: I personally reviewed and I agree with radiologist reading as below.  DG Chest 2 View  Result Date: 07/16/2020 CLINICAL DATA:  Questionable sepsis. EXAM: CHEST - 2 VIEW COMPARISON:  July 02, 2020 FINDINGS: The heart size and mediastinal contours are stable. Heart size is mildly enlarged. Mild increased pulmonary interstitium is identified bilaterally. No focal pneumonia or pleural effusion is noted. The visualized skeletal structures are unremarkable. IMPRESSION: Mild congestive heart failure. Electronically Signed   By: Abelardo Diesel M.D.   On: 07/16/2020 14:08   CT Head Wo Contrast  Result Date: 07/16/2020 CLINICAL DATA:  Altered mental status. EXAM: CT HEAD WITHOUT CONTRAST TECHNIQUE: Contiguous axial images were obtained from the base of the skull through the vertex without intravenous  contrast. COMPARISON:  July 11, 2020 FINDINGS: Brain: No evidence of acute infarction, hemorrhage, hydrocephalus, extra-axial collection or mass lesion/mass effect. There is chronic diffuse atrophy. Chronic bilateral periventricular white matter small vessel ischemic changes are noted. Vascular: No hyperdense vessel noted. Skull: Normal. Negative for fracture or focal lesion. Sinuses/Orbits: No acute finding. Other: None. IMPRESSION: 1. No focal acute intracranial abnormality identified. 2. Chronic diffuse atrophy. Chronic bilateral periventricular white matter small vessel ischemic change. Electronically Signed   By: Abelardo Diesel M.D.   On: 07/16/2020 14:01   MR BRAIN WO CONTRAST  Result Date: 07/16/2020 CLINICAL DATA:  Neuro deficit, acute, stroke suspected. Additional history provided: Weakness. EXAM: MRI HEAD WITHOUT CONTRAST TECHNIQUE: Multiplanar, multiecho pulse sequences of the brain and surrounding structures were obtained without intravenous contrast. COMPARISON:  Prior head CT examinations 07/16/2020 and earlier. Brain MRI 02/09/2019.  FINDINGS: Brain: Mild cerebral and cerebellar atrophy. Redemonstrated chronic lacunar infarcts within the right caudate head, bilateral thalami and bilateral cerebellar hemispheres. Background mild multifocal T2/FLAIR hyperintensity within the cerebral white matter and within the pons, nonspecific but compatible with chronic small vessel ischemic disease. There is no acute infarct. No evidence of intracranial mass. No chronic intracranial blood products. No extra-axial fluid collection. No midline shift. Vascular: Expected proximal arterial flow voids. Skull and upper cervical spine: No focal marrow lesion. Sinuses/Orbits: Visualized orbits show no acute finding. No significant paranasal sinus disease. IMPRESSION: No evidence of acute intracranial abnormality. Redemonstrated chronic lacunar infarcts within right caudate head, bilateral thalami and bilateral cerebellar  hemispheres. Background mild generalized parenchymal atrophy and chronic small vessel ischemic disease, stable as compared to the brain MRI of 02/09/2019. Electronically Signed   By: Kellie Simmering DO   On: 07/16/2020 19:20   CT Abdomen Pelvis W Contrast  Result Date: 07/16/2020 CLINICAL DATA:  Abdominal abscess, generalized weakness EXAM: CT ABDOMEN AND PELVIS WITH CONTRAST TECHNIQUE: Multidetector CT imaging of the abdomen and pelvis was performed using the standard protocol following bolus administration of intravenous contrast. CONTRAST:  42m OMNIPAQUE IOHEXOL 350 MG/ML SOLN COMPARISON:  03/26/2019 FINDINGS: Lower chest: Mild bibasilar atelectasis. The visualized heart and pericardium are unremarkable. Small hiatal hernia. Hepatobiliary: Multiple cysts are seen scattered throughout the liver, unchanged from prior examination. The liver is otherwise unremarkable. Gallbladder unremarkable. No intra or extrahepatic biliary ductal dilation. Pancreas: Unremarkable Spleen: Unremarkable Adrenals/Urinary Tract: The adrenal glands are unremarkable. The kidneys are normal in size and position. 5 mm nonobstructing calculus noted within the interpolar region of the left kidney. The kidneys are otherwise unremarkable. The bladder is unremarkable. Stomach/Bowel: Severe descending and sigmoid colonic diverticulosis. The stomach, small bowel, and large bowel are otherwise unremarkable there is no evidence of obstruction or focal inflammation. The appendix is normal. There is no free intraperitoneal gas or fluid. Vascular/Lymphatic: Extensive aortoiliac atherosclerotic calcification. No aortic aneurysm. No pathologic adenopathy within the abdomen and pelvis. Reproductive: Uterus and bilateral adnexa are unremarkable. Other: Tiny fat containing umbilical hernia.  Rectum unremarkable. Musculoskeletal: Osseous structures are age-appropriate. No lytic or blastic bone lesions. No acute bone abnormality. IMPRESSION: No acute  intra-abdominal pathology. No definite radiographic explanation for the patient's reported symptoms. Mild left nonobstructing nephrolithiasis. No urolithiasis. No hydronephrosis. Severe distal colonic diverticulosis without superimposed inflammatory change. Aortic Atherosclerosis (ICD10-I70.0). Electronically Signed   By: AFidela SalisburyMD   On: 07/16/2020 16:26   Labs on Admission: I have personally reviewed following labs  CBC: Recent Labs  Lab 07/11/20 1439 07/16/20 1212  WBC 6.4 8.1  NEUTROABS  --  6.5  HGB 12.4 12.3  HCT 37.5 36.6  MCV 87.8 86.9  PLT 198 1992  Basic Metabolic Panel: Recent Labs  Lab 07/11/20 1439 07/16/20 1212  NA 135 132*  K 3.7 3.7  CL 100 97*  CO2 26 24  GLUCOSE 122* 110*  BUN 17 24*  CREATININE 0.94 1.10*  CALCIUM 8.8* 8.1*   GFR: Estimated Creatinine Clearance: 38.4 mL/min (A) (by C-G formula based on SCr of 1.1 mg/dL (H)).  Liver Function Tests: Recent Labs  Lab 07/16/20 1212  AST 179*  ALT 65*  ALKPHOS 79  BILITOT 0.8  PROT 6.3*  ALBUMIN 3.0*   Coagulation Profile: Recent Labs  Lab 07/16/20 1212  INR 1.2   Urine analysis:    Component Value Date/Time   COLORURINE YELLOW (A) 07/16/2020 1212   APPEARANCEUR HAZY (A) 07/16/2020 1212  LABSPEC 1.025 07/16/2020 1212   PHURINE 6.0 07/16/2020 1212   GLUCOSEU NEGATIVE 07/16/2020 1212   HGBUR NEGATIVE 07/16/2020 1212   BILIRUBINUR NEGATIVE 07/16/2020 1212   KETONESUR 20 (A) 07/16/2020 1212   PROTEINUR 100 (A) 07/16/2020 1212   NITRITE NEGATIVE 07/16/2020 1212   LEUKOCYTESUR NEGATIVE 07/16/2020 1212   Crissy Mccreadie N Kuzey Ogata D.O. [facility]  If 7PM-7AM, please contact overnight-coverage provider If 7AM-7PM, please contact day coverage provider [email or web address]  07/16/2020, 10:47 PM

## 2020-07-17 ENCOUNTER — Observation Stay: Payer: Medicare Other

## 2020-07-17 ENCOUNTER — Encounter: Payer: Self-pay | Admitting: Internal Medicine

## 2020-07-17 DIAGNOSIS — Z20822 Contact with and (suspected) exposure to covid-19: Secondary | ICD-10-CM | POA: Diagnosis present

## 2020-07-17 DIAGNOSIS — J9601 Acute respiratory failure with hypoxia: Secondary | ICD-10-CM

## 2020-07-17 DIAGNOSIS — Z515 Encounter for palliative care: Secondary | ICD-10-CM | POA: Diagnosis not present

## 2020-07-17 DIAGNOSIS — M797 Fibromyalgia: Secondary | ICD-10-CM | POA: Diagnosis present

## 2020-07-17 DIAGNOSIS — G9341 Metabolic encephalopathy: Secondary | ICD-10-CM | POA: Diagnosis present

## 2020-07-17 DIAGNOSIS — R6520 Severe sepsis without septic shock: Secondary | ICD-10-CM | POA: Diagnosis present

## 2020-07-17 DIAGNOSIS — J189 Pneumonia, unspecified organism: Secondary | ICD-10-CM | POA: Diagnosis present

## 2020-07-17 DIAGNOSIS — Z87891 Personal history of nicotine dependence: Secondary | ICD-10-CM | POA: Diagnosis not present

## 2020-07-17 DIAGNOSIS — L89312 Pressure ulcer of right buttock, stage 2: Secondary | ICD-10-CM | POA: Diagnosis present

## 2020-07-17 DIAGNOSIS — R531 Weakness: Secondary | ICD-10-CM | POA: Diagnosis not present

## 2020-07-17 DIAGNOSIS — A419 Sepsis, unspecified organism: Secondary | ICD-10-CM | POA: Diagnosis present

## 2020-07-17 DIAGNOSIS — E785 Hyperlipidemia, unspecified: Secondary | ICD-10-CM | POA: Diagnosis present

## 2020-07-17 DIAGNOSIS — Z79899 Other long term (current) drug therapy: Secondary | ICD-10-CM | POA: Diagnosis not present

## 2020-07-17 DIAGNOSIS — F0281 Dementia in other diseases classified elsewhere with behavioral disturbance: Secondary | ICD-10-CM | POA: Diagnosis present

## 2020-07-17 DIAGNOSIS — Z7982 Long term (current) use of aspirin: Secondary | ICD-10-CM | POA: Diagnosis not present

## 2020-07-17 DIAGNOSIS — F419 Anxiety disorder, unspecified: Secondary | ICD-10-CM | POA: Diagnosis present

## 2020-07-17 DIAGNOSIS — Z66 Do not resuscitate: Secondary | ICD-10-CM | POA: Diagnosis present

## 2020-07-17 DIAGNOSIS — E876 Hypokalemia: Secondary | ICD-10-CM | POA: Diagnosis present

## 2020-07-17 DIAGNOSIS — E871 Hypo-osmolality and hyponatremia: Secondary | ICD-10-CM | POA: Diagnosis present

## 2020-07-17 DIAGNOSIS — G3183 Dementia with Lewy bodies: Secondary | ICD-10-CM | POA: Diagnosis present

## 2020-07-17 DIAGNOSIS — L89322 Pressure ulcer of left buttock, stage 2: Secondary | ICD-10-CM | POA: Diagnosis present

## 2020-07-17 DIAGNOSIS — Z7189 Other specified counseling: Secondary | ICD-10-CM | POA: Diagnosis not present

## 2020-07-17 DIAGNOSIS — F32A Depression, unspecified: Secondary | ICD-10-CM | POA: Diagnosis present

## 2020-07-17 DIAGNOSIS — R627 Adult failure to thrive: Secondary | ICD-10-CM | POA: Diagnosis present

## 2020-07-17 DIAGNOSIS — K219 Gastro-esophageal reflux disease without esophagitis: Secondary | ICD-10-CM | POA: Diagnosis present

## 2020-07-17 LAB — BASIC METABOLIC PANEL
Anion gap: 14 (ref 5–15)
BUN: 23 mg/dL (ref 8–23)
CO2: 18 mmol/L — ABNORMAL LOW (ref 22–32)
Calcium: 7.9 mg/dL — ABNORMAL LOW (ref 8.9–10.3)
Chloride: 98 mmol/L (ref 98–111)
Creatinine, Ser: 1.26 mg/dL — ABNORMAL HIGH (ref 0.44–1.00)
GFR, Estimated: 43 mL/min — ABNORMAL LOW (ref >60)
Glucose, Bld: 147 mg/dL — ABNORMAL HIGH (ref 70–99)
Potassium: 3.2 mmol/L — ABNORMAL LOW (ref 3.5–5.1)
Sodium: 130 mmol/L — ABNORMAL LOW (ref 135–145)

## 2020-07-17 LAB — BRAIN NATRIURETIC PEPTIDE: B Natriuretic Peptide: 76.6 pg/mL (ref 0.0–100.0)

## 2020-07-17 LAB — PHOSPHORUS: Phosphorus: 3.5 mg/dL (ref 2.5–4.6)

## 2020-07-17 LAB — URINE CULTURE: Culture: NO GROWTH

## 2020-07-17 LAB — MAGNESIUM: Magnesium: 1.3 mg/dL — ABNORMAL LOW (ref 1.7–2.4)

## 2020-07-17 LAB — PROCALCITONIN: Procalcitonin: 0.26 ng/mL

## 2020-07-17 LAB — VITAMIN B12: Vitamin B-12: 597 pg/mL (ref 180–914)

## 2020-07-17 MED ORDER — POTASSIUM CHLORIDE CRYS ER 20 MEQ PO TBCR
40.0000 meq | EXTENDED_RELEASE_TABLET | ORAL | Status: AC
  Administered 2020-07-17 (×2): 40 meq via ORAL
  Filled 2020-07-17 (×2): qty 2

## 2020-07-17 MED ORDER — VANCOMYCIN HCL 750 MG IV SOLR
750.0000 mg | INTRAVENOUS | Status: DC
  Filled 2020-07-17 (×3): qty 750

## 2020-07-17 MED ORDER — LABETALOL HCL 5 MG/ML IV SOLN
20.0000 mg | INTRAVENOUS | Status: DC | PRN
  Filled 2020-07-17: qty 4

## 2020-07-17 MED ORDER — MORPHINE SULFATE (PF) 2 MG/ML IV SOLN: 1.0000 mg | Freq: Once | INTRAVENOUS | Status: DC

## 2020-07-17 MED ORDER — FUROSEMIDE 10 MG/ML IJ SOLN
40.0000 mg | Freq: Once | INTRAMUSCULAR | Status: AC
  Administered 2020-07-17: 40 mg via INTRAVENOUS
  Filled 2020-07-17: qty 4

## 2020-07-17 MED ORDER — SODIUM CHLORIDE 0.9 % IV BOLUS
250.0000 mL | Freq: Once | INTRAVENOUS | Status: AC
  Administered 2020-07-17: 250 mL via INTRAVENOUS

## 2020-07-17 MED ORDER — IPRATROPIUM-ALBUTEROL 0.5-2.5 (3) MG/3ML IN SOLN: 3.0000 mL | RESPIRATORY_TRACT | Status: DC | PRN

## 2020-07-17 MED ORDER — VANCOMYCIN HCL 750 MG/150ML IV SOLN
750.0000 mg | INTRAVENOUS | Status: DC
  Administered 2020-07-17: 750 mg via INTRAVENOUS
  Filled 2020-07-17 (×2): qty 150

## 2020-07-17 NOTE — Progress Notes (Signed)
SLP Cancellation Note  Patient Details Name: Lindsay Chung MRN: [id number] DOB: [birth year]   Cancelled treatment:       Reason Eval/Treat Not Completed: Fatigue/lethargy limiting ability to participate. Pt was insufficiently arousable for swallow evaluation. Recommend continue NPO status. Will continue efforts.  Hollis Tuller B. Quentin Ore, [facility], CCC-SLP Speech Language Pathologist  Shonna Chock 07/17/2020, 10:10 AM

## 2020-07-17 NOTE — Progress Notes (Signed)
pt 132 appears more lethargic, noted to have increase work of breathing at 24bpm and copious amount of secretions. Lung field sounds course, maintaining sats of 95 on 6L, was suctioned, concern about about aspiration or fluid overload IvF paused.was going at 125 let me know if you want to restart and what rate.  Doctor informed see new orders.

## 2020-07-17 NOTE — Evaluation (Signed)
Occupational Therapy Evaluation Patient Details Name: Lindsay Chung MRN: [id number] DOB: 1941/02/15 Today's Date: 07/17/2020    History of Present Illness 80 y.o. female with medical history significant for Lewy body dementia, hyperlipidemia, depression, anxiety, GERD, and hypertension, who presents to the emergency department for chief concerns of lethargy.   Clinical Impression   Pt seen for OT evaluation on this date. Pt lethargic at start of session, however agreeable to evaluation, with alertness improving over time. PLOF obtained from chart review and discussion with daughter at bedside. Up until three weeks ago, pt was able to ambulate with walker and perform seated ADLs independently. Over the past week, pt has been spending her day in bed and receiving MAX assist for bed<>BSC transfers. Pt currently presents with impaired cognition and decreased strength and activity tolerance, requiring MOD A for bed-level grooming tasks. Unsafe to attempt OOB mobility this date d/t pts level of alertness and difficultly sequencing tasks. Pt would benefit from additional skilled OT services to maximize return to PLOF and prevent further deconditioning. Upon discharge, recommend SNF.      Follow Up Recommendations  SNF    Equipment Recommendations  Other (comment) (defer to next venue of care)       Precautions / Restrictions Precautions Precautions: Fall Restrictions Weight Bearing Restrictions: No      Mobility Bed Mobility Overal bed mobility: Needs Assistance Bed Mobility: Supine to Sit;Sit to Supine     Supine to sit: Max assist Sit to supine: Max assist;+2 for physical assistance   General bed mobility comments: Unsafe to attempt d/t pt's level of alertness           ADL either performed or assessed with clinical judgement   ADL Overall ADL's : Needs assistance/impaired     Grooming: Wash/dry face;Brushing hair;Moderate assistance;Bed level Grooming Details (indicate cue  type and reason): MOD A to perform bed-level grooming in setting of UE weakness and difficultly sequencing                                     Vision   Additional Comments: difficult to formally assess d/t cognition, pt appears to mostly track appropriately in the room            Pertinent Vitals/Pain Pain Assessment: No/denies pain        Extremity/Trunk Assessment Upper Extremity Assessment Upper Extremity Assessment: Generalized weakness   Lower Extremity Assessment Lower Extremity Assessment: Generalized weakness       Communication Communication Communication: No difficulties   Cognition Arousal/Alertness: Lethargic Behavior During Therapy: WFL for tasks assessed/performed Overall Cognitive Status: History of cognitive impairments - at baseline                                 General Comments: Pt lethargic at start of session, however alertness improving over time. Pt with difficutly sequencing tasks and oriented to self only              Home Living Family/patient expects to be discharged to:: Private residence Living Arrangements: Spouse/significant other Available Help at Discharge: Family;Available 24 hours/day Type of Home: Apartment Home Access: Level entry     Home Layout: One level     Bathroom Shower/Tub: Teacher, early years/pre: Standard     Home Equipment: Environmental consultant - 4 wheels;Shower seat;Bedside commode;Wheelchair - manual  Additional Comments: PLOF obtained from chart review and discussion with daughter at bedside.      Prior Functioning/Environment Level of Independence: Needs assistance  Gait / Transfers Assistance Needed: Pt was able to ambulate with walker ~3 weeks ago (but required some assist to stand from couch) but a week ago, pt was unable to walk and family used rollator (with pt sitting on rollator seat) to get around the home. Since last admission, pt has been staying in bed and receiving  MAX assist for bed<>BSC transfers. ADL's / Homemaking Assistance Needed: Assist for medications, ADL's, IADL's, and bathing (pt used to be able to step into tub shower and sit in shower seat for bathing but most recently pt requiring sponge baths)   Comments: history obtained from previous admission. Per patient, she has been non ambulatory this past week.        OT Problem List: Decreased strength;Decreased activity tolerance;Impaired balance (sitting and/or standing);Decreased coordination;Decreased cognition;Decreased safety awareness;Decreased knowledge of use of DME or AE;Decreased knowledge of precautions      OT Treatment/Interventions: Self-care/ADL training;DME and/or AE instruction;Therapeutic activities;Balance training;Therapeutic exercise;Energy conservation;Patient/family education    OT Goals(Current goals can be found in the care plan section) Acute Rehab OT Goals Patient Stated Goal: to get stronger OT Goal Formulation: With family Time For Goal Achievement: 07/31/20 Potential to Achieve Goals: Fair ADL Goals Pt Will Perform Grooming: with set-up;with supervision;sitting Pt Will Perform Upper Body Dressing: with set-up;with supervision;sitting  OT Frequency: Min 1X/week    AM-PAC OT "6 Clicks" Daily Activity     Outcome Measure Help from another person eating meals?: A Little Help from another person taking care of personal grooming?: A Lot Help from another person toileting, which includes using toliet, bedpan, or urinal?: Total Help from another person bathing (including washing, rinsing, drying)?: A Lot Help from another person to put on and taking off regular upper body clothing?: A Little Help from another person to put on and taking off regular lower body clothing?: A Lot 6 Click Score: 13   End of Session Nurse Communication: Mobility status  Activity Tolerance: Patient limited by lethargy Patient left: in bed;with call bell/phone within reach;with bed  alarm set;with family/visitor present  OT Visit Diagnosis: Unsteadiness on feet (R26.81);Muscle weakness (generalized) (M62.81);Other symptoms and signs involving cognitive function                Time: 1400-1417 OT Time Calculation (min): 17 min Charges:  OT General Charges $OT Visit: 1 Visit OT Evaluation $OT Eval Moderate Complexity: [place], OTR/L [place]

## 2020-07-17 NOTE — Plan of Care (Signed)
  Problem: Education: Goal: Knowledge of General Education information will improve Description: Including pain rating scale, medication(s)/side effects and non-pharmacologic comfort measures Outcome: Progressing   Problem: Clinical Measurements: Goal: Ability to maintain clinical measurements within normal limits will improve Outcome: Progressing Goal: Will remain free from infection Outcome: Progressing Goal: Diagnostic test results will improve Outcome: Progressing Goal: Respiratory complications will improve Outcome: Progressing Goal: Cardiovascular complication will be avoided Outcome: Progressing   Problem: Nutrition: Goal: Adequate nutrition will be maintained Outcome: Progressing   Problem: Coping: Goal: Level of anxiety will decrease Outcome: Progressing   Problem: Education: Goal: Knowledge of General Education information will improve Description: Including pain rating scale, medication(s)/side effects and non-pharmacologic comfort measures Outcome: Progressing   Problem: Clinical Measurements: Goal: Ability to maintain clinical measurements within normal limits will improve Outcome: Progressing Goal: Will remain free from infection Outcome: Progressing Goal: Diagnostic test results will improve Outcome: Progressing Goal: Respiratory complications will improve Outcome: Progressing Goal: Cardiovascular complication will be avoided Outcome: Progressing   Problem: Nutrition: Goal: Adequate nutrition will be maintained Outcome: Progressing   Problem: Coping: Goal: Level of anxiety will decrease Outcome: Progressing

## 2020-07-17 NOTE — TOC Progression Note (Signed)
Transition of Care [facility]) - Progression Note    Patient Details  Name: Lindsay Chung MRN: [id number] Date of Birth: 06/23/1940  Transition [facility]) CM/SW Contact  Anselm Pancoast, RN Phone Number: 07/17/2020, 5:36 PM  Clinical Narrative:    Waiting for bed offer to start insurance authorization. Will send Fl2 to facilities once signed. Already sent therapy notes for review.         Expected Discharge Plan and Services                                                 Social Determinants of Health (SDOH) Interventions    Readmission Risk Interventions No flowsheet data found.

## 2020-07-17 NOTE — Evaluation (Signed)
Physical Therapy Evaluation Patient Details Name: Lindsay Chung MRN: [id number] DOB: 07-24-1940 Today's Date: 07/17/2020   History of Present Illness  Pt is admitted for anxiety with complaints of weakness. HIstory includes lewy body dementia, HLD, depression, anxiety, GERD, HTN, and fibromyalgia.  Negative MRI imaging at this time.  Clinical Impression  Pt is a pleasant 80 year old female who was admitted for anxiety and weakness. Pt performs bed mobility with max assist, needing +2 for repositioning in bed. Poor sitting balance at EOB. Unable to further perform mobility at this time. O2 at 6L of O2, weaned to 4L with sats at 91%. Pt demonstrates deficits with strength/cognition/endurance. Would benefit from skilled PT to address above deficits and promote optimal return to PLOF; recommend transition to STR upon discharge from acute hospitalization.     Follow Up Recommendations SNF    Equipment Recommendations  None recommended by PT    Recommendations for Other Services       Precautions / Restrictions Precautions Precautions: Fall Restrictions Weight Bearing Restrictions: No      Mobility  Bed Mobility Overal bed mobility: Needs Assistance Bed Mobility: Supine to Sit;Sit to Supine     Supine to sit: Max assist Sit to supine: Max assist;+2 for physical assistance   General bed mobility comments: max assist for transitioning to sitting at EOB. Unable to maintain upright posture without total assist. Fatigue quickly. O2 sats at 91% on 4L of O2. Needed +2 assist for respositioning and scooting up towards [place].    Transfers                 General transfer comment: unsafe  Ambulation/Gait                Stairs            Wheelchair Mobility    Modified Rankin (Stroke Patients Only)       Balance Overall balance assessment: Needs assistance Sitting-balance support: Feet unsupported;Bilateral upper extremity supported Sitting balance-Leahy  Scale: Poor Sitting balance - Comments: pt unable to maintain seated balance                                     Pertinent Vitals/Pain Pain Assessment: No/denies pain    Home Living Family/patient expects to be discharged to:: Private residence Living Arrangements: Spouse/significant other Available Help at Discharge: Family;Available 24 hours/day Type of Home: Apartment Home Access: Level entry     Home Layout: One level Home Equipment: Walker - 4 wheels;Shower seat Additional Comments: Pt is poor historian. All history obtained from previous admission. Of note, hospital ordered [facility] and hospital bed last admission of which pt states she has at home    Prior Function Level of Independence: Needs assistance   Gait / Transfers Assistance Needed: Pt was able to ambulate with walker about 2 weeks ago (but required some assist to stand from couch) but last 6-7 days pt unable to walk and family using rollator (pt sitting on rollator seat) to get around the home.  Pt's bed is higher and pt requires assist d/t this.  ADL's / Homemaking Assistance Needed: Assist for medications, ADL's, IADL's, and bathing (pt used to be able to step into tub shower and sit in shower seat for bathing but most recently pt requiring sponge baths)  Comments: history obtained from previous admission. Per patient, she has been non ambulatory this past week.  Hand Dominance        Extremity/Trunk Assessment   Upper Extremity Assessment Upper Extremity Assessment: Generalized weakness (B UE grossly 3+/5)    Lower Extremity Assessment Lower Extremity Assessment: Generalized weakness (B LE grossly 2/5)       Communication   Communication: No difficulties  Cognition Arousal/Alertness: Awake/alert Behavior During Therapy: WFL for tasks assessed/performed Overall Cognitive Status: History of cognitive impairments - at baseline                                 General  Comments: oriented to person/place, however confused to situation.      General Comments      Exercises Other Exercises Other Exercises: supine ther-ex performed on B LE with mod/max assist including AP, SLRs, and hip abd/add. 10 reps performed   Assessment/Plan    PT Assessment Patient needs continued PT services  PT Problem List Decreased strength;Decreased activity tolerance;Decreased balance;Decreased mobility;Decreased cognition;Decreased knowledge of use of DME;Decreased safety awareness;Decreased knowledge of precautions       PT Treatment Interventions DME instruction;Gait training;Functional mobility training;Therapeutic activities;Therapeutic exercise;Balance training;Patient/family education;Cognitive remediation    PT Goals (Current goals can be found in the Care Plan section)  Acute Rehab PT Goals Patient Stated Goal: to improve mobility PT Goal Formulation: With patient Time For Goal Achievement: 07/31/20 Potential to Achieve Goals: Fair    Frequency Min 2X/week   Barriers to discharge        Co-evaluation               AM-PAC PT "6 Clicks" Mobility  Outcome Measure Help needed turning from your back to your side while in a flat bed without using bedrails?: Total Help needed moving from lying on your back to sitting on the side of a flat bed without using bedrails?: Total Help needed moving to and from a bed to a chair (including a wheelchair)?: Total Help needed standing up from a chair using your arms (e.g., wheelchair or bedside chair)?: Total Help needed to walk in hospital room?: Total Help needed climbing 3-5 steps with a railing? : Total 6 Click Score: 6    End of Session Equipment Utilized During Treatment: Oxygen Activity Tolerance: Patient limited by fatigue Patient left: in bed;with bed alarm set Nurse Communication: Mobility status PT Visit Diagnosis: Unsteadiness on feet (R26.81);Muscle weakness (generalized) (M62.81);Difficulty in  walking, not elsewhere classified (R26.2)    Time: 2376-2831 PT Time Calculation (min) (ACUTE ONLY): 19 min   Charges:   PT Evaluation $PT Eval Low Complexity: 1 Low PT Treatments $Therapeutic Exercise: 8-22 mins        Greggory Stallion, PT, DPT [phone]   Marvene Strohm 07/17/2020, 12:06 PM

## 2020-07-17 NOTE — Progress Notes (Signed)
Pt seen resting in bed relative called and updated about clinical status. Condition remains stable.

## 2020-07-17 NOTE — Progress Notes (Signed)
132, Lindsay Chung came in today with generalized weakness r/o sepsi,. Presently on 125 n/s and IV antibiotic, concern about increases oxygen demand after medication administration was on 2l On assessment pt noted to have coarse breathing , r/r at 22 on 2L oxy sats at 82 and sustaining increased to 4l sustains at 87 placed on 6l via n/c now at 95% on same. other vital signs stable and resting ok nil complaints of chest pain on tele , could benefit from continuous pulse ox. Please for any further intervention. On call doctor aware.  Placed on continius pulse ox, see new orders

## 2020-07-17 NOTE — Progress Notes (Signed)
OT Cancellation Note  Patient Details Name: Lindsay Chung MRN: [id number] DOB: 07-Aug-1940   Cancelled Treatment:    Reason Eval/Treat Not Completed: Fatigue/lethargy limiting ability to participate. Orders received and chart reviewed. Upon arrival to room, pt sound asleep and snoring (HR 70s), unable to be awoken following touch, name called, and hot & cold stimulus applied to face. RN aware. OT to re-attempt at later time/date as able.  Fredirick Maudlin, OTR/L [place]

## 2020-07-17 NOTE — Progress Notes (Signed)
Progress Note    Lindsay Chung  BSJ:[id number] DOB: 1941/02/06  DOA: 07/16/2020 PCP: Tracie Harrier, MD      Brief Narrative:    Medical records reviewed and are as summarized below:  Lindsay Chung is a 80 y.o. female with past medical history significant for Lewy body dementia, hyperlipidemia, depression, anxiety, GERD, hypertension, was brought to the hospital because of lethargy.      Assessment/Plan:   Active Problems:   Anxiety   Dementia without behavioral disturbance (HCC)   Lewy body dementia with behavioral disturbance (HCC)   Weakness   Acute respiratory failure with hypoxia (HCC)   Body mass index is 26.56 kg/m.    Suspected sepsis secondary to pneumonia: Continue empiric IV antibiotics.  Follow-up blood cultures.  CT chest ordered today showed mild pulmonary edema per radiologist is difficult to definitively exclude pneumonia.  Bilateral renal stones on CT chest.  Discontinue IV fluids.  Acute hypoxic respiratory failure: Taper down oxygen as able.  Down from 6 L/min to 4 L/min.  Lethargy/altered mental status likely due to acute metabolic encephalopathy.  Continue supportive care.  Lewy body dementia: Continue Aricept and Namenda.  Supportive care.  Other: Please include anxiety, depression, hypertension and hyperlipidemia.  Hyponatremia: Monitor BMP.  Hypokalemia: Replete potassium   Diet Order            Diet NPO time specified Except for: Sips with Meds, Ice Chips  Diet effective now                    Consultants:  None  Procedures:  None    Medications:   . aspirin  81 mg Oral Daily  . busPIRone  15 mg Oral TID  . dextromethorphan-guaiFENesin  1 tablet Oral BID  . donepezil  5 mg Oral QHS  . enoxaparin (LOVENOX) injection  40 mg Subcutaneous Q24H  . FLUoxetine  20 mg Oral BID  . loratadine  10 mg Oral Daily  . memantine  5 mg Oral BID  . metoprolol tartrate  25 mg Oral BID  .  morphine injection  1 mg  Intravenous Once  . multivitamin with minerals  1 tablet Oral Daily  . potassium chloride  40 mEq Oral Q2H  . pravastatin  20 mg Oral q1800  . risperiDONE  0.5 mg Oral BID  . vitamin B-12  1,000 mcg Oral Daily   Continuous Infusions: . ceFEPime (MAXIPIME) IV 2 g (07/17/20 1346)  . metronidazole 500 mg (07/17/20 0630)  . vancomycin       Anti-infectives (From admission, onward)   Start     Dose/Rate Route Frequency Ordered Stop   07/17/20 1400  vancomycin (VANCOREADY) IVPB 1000 mg/200 mL  Status:  Discontinued        1,000 mg 200 mL/hr over 60 Minutes Intravenous Every 24 hours 07/16/20 2056 07/17/20 1217   07/17/20 1400  vancomycin (VANCOCIN) 750 mg in sodium chloride 0.9 % 250 mL IVPB        750 mg 250 mL/hr over 60 Minutes Intravenous Every 24 hours 07/17/20 1217     07/17/20 0000  ceFEPIme (MAXIPIME) 2 g in sodium chloride 0.9 % 100 mL IVPB        2 g 200 mL/hr over 30 Minutes Intravenous Every 12 hours 07/16/20 1804     07/16/20 1800  metroNIDAZOLE (FLAGYL) IVPB 500 mg        500 mg 100 mL/hr over 60 Minutes Intravenous Every 8  hours 07/16/20 1736     07/16/20 1800  vancomycin (VANCOREADY) IVPB 500 mg/100 mL        500 mg 100 mL/hr over 60 Minutes Intravenous  Once 07/16/20 1759 07/16/20 2345   07/16/20 1245  metroNIDAZOLE (FLAGYL) IVPB 500 mg        500 mg 100 mL/hr over 60 Minutes Intravenous  Once 07/16/20 1233 07/16/20 1345   07/16/20 1245  vancomycin (VANCOCIN) IVPB 1000 mg/200 mL premix        1,000 mg 200 mL/hr over 60 Minutes Intravenous  Once 07/16/20 1233 07/16/20 1446   07/16/20 1245  ceFEPIme (MAXIPIME) 2 g in sodium chloride 0.9 % 100 mL IVPB        2 g 200 mL/hr over 30 Minutes Intravenous  Once 07/16/20 1233 07/16/20 1345   07/16/20 1215  ceFEPIme (MAXIPIME) 2 g in sodium chloride 0.9 % 100 mL IVPB  Status:  Discontinued        2 g 200 mL/hr over 30 Minutes Intravenous  Once 07/16/20 1204 07/16/20 1220   07/16/20 1215  metroNIDAZOLE (FLAGYL) IVPB 500 mg   Status:  Discontinued        500 mg 100 mL/hr over 60 Minutes Intravenous  Once 07/16/20 1204 07/16/20 1220   07/16/20 1215  vancomycin (VANCOCIN) IVPB 1000 mg/200 mL premix  Status:  Discontinued        1,000 mg 200 mL/hr over 60 Minutes Intravenous  Once 07/16/20 1204 07/16/20 1220             Family Communication/Anticipated D/C date and plan/Code Status   DVT prophylaxis: enoxaparin (LOVENOX) injection 40 mg Start: 07/16/20 2200 Place TED hose Start: 07/16/20 1734     Code Status: DNR  Family Communication: None Disposition Plan:    Status is: Inpatient  Remains inpatient appropriate because:Inpatient level of care appropriate due to severity of illness   Dispo: The patient is from: Home              Anticipated d/c is to: Home              Patient currently is not medically stable to d/c.   Difficult to place patient No                Subjective:   Interval events noted.  She is confused unable to provide any history.  Objective:    Vitals:   07/17/20 0428 07/17/20 0503 07/17/20 0632 07/17/20 0736  BP: 139/82 138/69 (!) 98/56 (!) 145/86  Pulse: 74 (!) 110 79 97  Resp: (!) 28 (!) 22 18 15   Temp: 99.5 F (37.5 C) 98.2 F (36.8 C) 98 F (36.7 C) 98.1 F (36.7 C)  TempSrc: Oral   Oral  SpO2: 95% 94% 98% 97%  Weight:      Height:       No data found.   Intake/Output Summary (Last 24 hours) at 07/17/2020 1432 Last data filed at 07/17/2020 0939 Gross per 24 hour  Intake 700 ml  Output 300 ml  Net 400 ml   Filed Weights   07/16/20 1211  Weight: 68 kg    Exam:  GEN: NAD SKIN: No rash EYES: EOMI ENT: MMM CV: RRR PULM: CTA B ABD: soft, ND, NT, +BS CNS: Lethargic and confused.  Speech is slurred.?  Left facial droop. EXT: No edema or tenderness    Pressure Injury 03/28/19 Bilateral Stage 2 -  Partial thickness loss of dermis presenting as a shallow open  injury with a red, pink wound bed without slough. (Active)  03/28/19  0510  Location:   Location Orientation: Bilateral  Staging: Stage 2 -  Partial thickness loss of dermis presenting as a shallow open injury with a red, pink wound bed without slough.  Wound Description (Comments):   Present on Admission:      Pressure Injury 03/28/19 Buttocks Right;Left Stage 2 -  Partial thickness loss of dermis presenting as a shallow open injury with a red, pink wound bed without slough. Rt 4.5x0.1/Lt 0.1x0.1/0.1x0.1/0.1/0.1 (Active)  03/28/19 0514  Location: Buttocks  Location Orientation: Right;Left  Staging: Stage 2 -  Partial thickness loss of dermis presenting as a shallow open injury with a red, pink wound bed without slough.  Wound Description (Comments): Rt 4.5x0.1/Lt 0.1x0.1/0.1x0.1/0.1/0.1  Present on Admission:      Data Reviewed:   I have personally reviewed following labs and imaging studies:  Labs: Labs show the following:   Basic Metabolic Panel: Recent Labs  Lab 07/11/20 1439 07/16/20 1212 07/17/20 0833  NA 135 132* 130*  K 3.7 3.7 3.2*  CL 100 97* 98  CO2 26 24 18*  GLUCOSE 122* 110* 147*  BUN 17 24* 23  CREATININE 0.94 1.10* 1.26*  CALCIUM 8.8* 8.1* 7.9*   GFR Estimated Creatinine Clearance: 33.5 mL/min (A) (by C-G formula based on SCr of 1.26 mg/dL (H)). Liver Function Tests: Recent Labs  Lab 07/16/20 1212  AST 179*  ALT 65*  ALKPHOS 79  BILITOT 0.8  PROT 6.3*  ALBUMIN 3.0*   No results for input(s): LIPASE, AMYLASE in the last 168 hours. No results for input(s): AMMONIA in the last 168 hours. Coagulation profile Recent Labs  Lab 07/16/20 1212  INR 1.2    CBC: Recent Labs  Lab 07/11/20 1439 07/16/20 1212  WBC 6.4 8.1  NEUTROABS  --  6.5  HGB 12.4 12.3  HCT 37.5 36.6  MCV 87.8 86.9  PLT 198 180   Cardiac Enzymes: No results for input(s): CKTOTAL, CKMB, CKMBINDEX, TROPONINI in the last 168 hours. BNP (last 3 results) No results for input(s): PROBNP in the last 8760 hours. CBG: No results for input(s):  GLUCAP in the last 168 hours. D-Dimer: No results for input(s): DDIMER in the last 72 hours. Hgb A1c: No results for input(s): HGBA1C in the last 72 hours. Lipid Profile: No results for input(s): CHOL, HDL, LDLCALC, TRIG, CHOLHDL, LDLDIRECT in the last 72 hours. Thyroid function studies: No results for input(s): TSH, T4TOTAL, T3FREE, THYROIDAB in the last 72 hours.  Invalid input(s): FREET3 Anemia work up: Recent Labs    07/17/20 [place] 597   Sepsis Labs: Recent Labs  Lab 07/11/20 1439 07/16/20 1212 07/17/20 0307  PROCALCITON  --  0.11 0.26  WBC 6.4 8.1  --   LATICACIDVEN  --  0.9  --     Microbiology Recent Results (from the past 240 hour(s))  Resp Panel by RT-PCR (Flu A&B, Covid) Nasopharyngeal Swab     Status: None   Collection Time: 07/11/20  3:53 PM   Specimen: Nasopharyngeal Swab; Nasopharyngeal(NP) swabs in vial transport medium  Result Value Ref Range Status   SARS Coronavirus 2 by RT PCR NEGATIVE NEGATIVE Final    Comment: (NOTE) SARS-CoV-2 target nucleic acids are NOT DETECTED.  The SARS-CoV-2 RNA is generally detectable in upper respiratory specimens during the acute phase of infection. The lowest concentration of SARS-CoV-2 viral copies this assay can detect is 138 copies/mL. A negative result does not preclude  SARS-Cov-2 infection and should not be used as the sole basis for treatment or other patient management decisions. A negative result may occur with  improper specimen collection/handling, submission of specimen other than nasopharyngeal swab, presence of viral mutation(s) within the areas targeted by this assay, and inadequate number of viral copies(<138 copies/mL). A negative result must be combined with clinical observations, patient history, and epidemiological information. The expected result is Negative.  Fact Sheet for Patients:  EntrepreneurPulse.com.au  Fact Sheet for Healthcare Providers:   IncredibleEmployment.be  This test is no t yet approved or cleared by the [place] FDA and  has been authorized for detection and/or diagnosis of SARS-CoV-2 by FDA under an Emergency Use Authorization (EUA). This EUA will remain  in effect (meaning this test can be used) for the duration of the COVID-19 declaration under Section 564(b)(1) of the Act, 21 U.S.C.section 360bbb-3(b)(1), unless the authorization is terminated  or revoked sooner.       Influenza A by PCR NEGATIVE NEGATIVE Final   Influenza B by PCR NEGATIVE NEGATIVE Final    Comment: (NOTE) The Xpert Xpress SARS-CoV-2/FLU/RSV plus assay is intended as an aid in the diagnosis of influenza from Nasopharyngeal swab specimens and should not be used as a sole basis for treatment. Nasal washings and aspirates are unacceptable for Xpert Xpress SARS-CoV-2/FLU/RSV testing.  Fact Sheet for Patients: EntrepreneurPulse.com.au  Fact Sheet for Healthcare Providers: IncredibleEmployment.be  This test is not yet approved or cleared by the [place] FDA and has been authorized for detection and/or diagnosis of SARS-CoV-2 by FDA under an Emergency Use Authorization (EUA). This EUA will remain in effect (meaning this test can be used) for the duration of the COVID-19 declaration under Section 564(b)(1) of the Act, 21 U.S.C. section 360bbb-3(b)(1), unless the authorization is terminated or revoked.  Performed at [facility], [address] [place], [place] [address]   Resp Panel by RT-PCR (Flu A&B, Covid) Nasopharyngeal Swab     Status: None   Collection Time: 07/16/20 12:12 PM   Specimen: Nasopharyngeal Swab; Nasopharyngeal(NP) swabs in vial transport medium  Result Value Ref Range Status   SARS Coronavirus 2 by RT PCR NEGATIVE NEGATIVE Final    Comment: (NOTE) SARS-CoV-2 target nucleic acids are NOT DETECTED.  The SARS-CoV-2 RNA is generally detectable  in upper respiratory specimens during the acute phase of infection. The lowest concentration of SARS-CoV-2 viral copies this assay can detect is 138 copies/mL. A negative result does not preclude SARS-Cov-2 infection and should not be used as the sole basis for treatment or other patient management decisions. A negative result may occur with  improper specimen collection/handling, submission of specimen other than nasopharyngeal swab, presence of viral mutation(s) within the areas targeted by this assay, and inadequate number of viral copies(<138 copies/mL). A negative result must be combined with clinical observations, patient history, and epidemiological information. The expected result is Negative.  Fact Sheet for Patients:  EntrepreneurPulse.com.au  Fact Sheet for Healthcare Providers:  IncredibleEmployment.be  This test is no t yet approved or cleared by the [place] FDA and  has been authorized for detection and/or diagnosis of SARS-CoV-2 by FDA under an Emergency Use Authorization (EUA). This EUA will remain  in effect (meaning this test can be used) for the duration of the COVID-19 declaration under Section 564(b)(1) of the Act, 21 U.S.C.section 360bbb-3(b)(1), unless the authorization is terminated  or revoked sooner.       Influenza A by PCR NEGATIVE NEGATIVE Final   Influenza  B by PCR NEGATIVE NEGATIVE Final    Comment: (NOTE) The Xpert Xpress SARS-CoV-2/FLU/RSV plus assay is intended as an aid in the diagnosis of influenza from Nasopharyngeal swab specimens and should not be used as a sole basis for treatment. Nasal washings and aspirates are unacceptable for Xpert Xpress SARS-CoV-2/FLU/RSV testing.  Fact Sheet for Patients: EntrepreneurPulse.com.au  Fact Sheet for Healthcare Providers: IncredibleEmployment.be  This test is not yet approved or cleared by the [place] FDA and has  been authorized for detection and/or diagnosis of SARS-CoV-2 by FDA under an Emergency Use Authorization (EUA). This EUA will remain in effect (meaning this test can be used) for the duration of the COVID-19 declaration under Section 564(b)(1) of the Act, 21 U.S.C. section 360bbb-3(b)(1), unless the authorization is terminated or revoked.  Performed at [facility], [address] [place], [place] [address]   Blood Culture (routine x 2)     Status: None (Preliminary result)   Collection Time: 07/16/20 12:12 PM   Specimen: BLOOD  Result Value Ref Range Status   Specimen Description   Final    BLOOD Blood Culture results may not be optimal due to an inadequate volume of blood received in culture bottles   Special Requests   Final    BOTTLES DRAWN AEROBIC AND ANAEROBIC RIGHT ANTECUBITAL   Culture   Final    NO GROWTH < 24 HOURS Performed at [facility], [address] [place], [place] [address]    Report Status PENDING  Incomplete  Blood Culture (routine x 2)     Status: None (Preliminary result)   Collection Time: 07/16/20 12:12 PM   Specimen: BLOOD  Result Value Ref Range Status   Specimen Description   Final    BLOOD Blood Culture results may not be optimal due to an inadequate volume of blood received in culture bottles   Special Requests   Final    BOTTLES DRAWN AEROBIC AND ANAEROBIC BLOOD LEFT HAND   Culture   Final    NO GROWTH < 24 HOURS Performed at [facility], [address] [place], [place] [address]    Report Status PENDING  Incomplete  Urine culture     Status: None   Collection Time: 07/16/20 12:12 PM   Specimen: Urine, Random  Result Value Ref Range Status   Specimen Description   Final    URINE, RANDOM Performed at [facility], [address], [place], [place] [address]    Special Requests   Final    NONE Performed at [facility], [address] [place], [place] [address]    Culture   Final    NO  GROWTH Performed at [facility], [place] [address], [place],  [address]    Report Status 07/17/2020 FINAL  Final    Procedures and diagnostic studies:  DG Chest 2 View  Result Date: 07/16/2020 CLINICAL DATA:  Questionable sepsis. EXAM: CHEST - 2 VIEW COMPARISON:  July 02, 2020 FINDINGS: The heart size and mediastinal contours are stable. Heart size is mildly enlarged. Mild increased pulmonary interstitium is identified bilaterally. No focal pneumonia or pleural effusion is noted. The visualized skeletal structures are unremarkable. IMPRESSION: Mild congestive heart failure. Electronically Signed   By: Abelardo Diesel M.D.   On: 07/16/2020 14:08   CT Head Wo Contrast  Result Date: 07/16/2020 CLINICAL DATA:  Altered mental status. EXAM: CT HEAD WITHOUT CONTRAST TECHNIQUE: Contiguous axial images were obtained from the base of the skull  through the vertex without intravenous contrast. COMPARISON:  July 11, 2020 FINDINGS: Brain: No evidence of acute infarction, hemorrhage, hydrocephalus, extra-axial collection or mass lesion/mass effect. There is chronic diffuse atrophy. Chronic bilateral periventricular white matter small vessel ischemic changes are noted. Vascular: No hyperdense vessel noted. Skull: Normal. Negative for fracture or focal lesion. Sinuses/Orbits: No acute finding. Other: None. IMPRESSION: 1. No focal acute intracranial abnormality identified. 2. Chronic diffuse atrophy. Chronic bilateral periventricular white matter small vessel ischemic change. Electronically Signed   By: Abelardo Diesel M.D.   On: 07/16/2020 14:01   CT CHEST WO CONTRAST  Result Date: 07/17/2020 CLINICAL DATA:  Respiratory failure, lethargy, increased work of breathing. EXAM: CT CHEST WITHOUT CONTRAST TECHNIQUE: Multidetector CT imaging of the chest was performed following the standard protocol without IV contrast. COMPARISON:  07/02/2020. FINDINGS: Cardiovascular: Atherosclerotic calcification of the aorta,  aortic valve and coronary arteries. Pulmonic trunk and heart are enlarged. No pericardial effusion. Mediastinum/Nodes: No pathologically enlarged mediastinal or axillary lymph nodes. Hilar regions are difficult to definitively evaluate without IV contrast. Esophagus is grossly unremarkable. Lungs/Pleura: Image quality is rather degraded by expiratory phase imaging and respiratory motion. New patchy areas of ground-glass bilaterally with dependent atelectasis. Associated smooth septal thickening. No pleural fluid. Debris is seen in the airway. Upper Abdomen: Low-attenuation lesions in the liver measure up to 1.6 cm in the dome, as before. Further characterization is limited without post-contrast imaging or due to size. Visualized portions of the liver and gallbladder are otherwise unremarkable. Nodular thickening of the right adrenal gland. Adreniform thickening of the left adrenal gland. Stones in the kidneys bilaterally. There may be a subcentimeter exophytic lesion off the interpolar left kidney, incompletely visualized. Visualized portions of the kidneys, spleen, pancreas, stomach and bowel are otherwise unremarkable. No upper abdominal adenopathy. Musculoskeletal: Degenerative changes in the spine and shoulders. IMPRESSION: 1. Image quality is degraded by expiratory phase imaging and respiratory motion. Mild pulmonary edema. Difficult to definitively exclude pneumonia. 2. Bilateral renal stones. 3. Aortic atherosclerosis (ICD10-I70.0). Coronary artery calcification. 4. Enlarged pulmonic trunk, indicative of pulmonary arterial hypertension. Electronically Signed   By: Lorin Picket M.D.   On: 07/17/2020 13:36   MR BRAIN WO CONTRAST  Result Date: 07/16/2020 CLINICAL DATA:  Neuro deficit, acute, stroke suspected. Additional history provided: Weakness. EXAM: MRI HEAD WITHOUT CONTRAST TECHNIQUE: Multiplanar, multiecho pulse sequences of the brain and surrounding structures were obtained without intravenous  contrast. COMPARISON:  Prior head CT examinations 07/16/2020 and earlier. Brain MRI 02/09/2019. FINDINGS: Brain: Mild cerebral and cerebellar atrophy. Redemonstrated chronic lacunar infarcts within the right caudate head, bilateral thalami and bilateral cerebellar hemispheres. Background mild multifocal T2/FLAIR hyperintensity within the cerebral white matter and within the pons, nonspecific but compatible with chronic small vessel ischemic disease. There is no acute infarct. No evidence of intracranial mass. No chronic intracranial blood products. No extra-axial fluid collection. No midline shift. Vascular: Expected proximal arterial flow voids. Skull and upper cervical spine: No focal marrow lesion. Sinuses/Orbits: Visualized orbits show no acute finding. No significant paranasal sinus disease. IMPRESSION: No evidence of acute intracranial abnormality. Redemonstrated chronic lacunar infarcts within right caudate head, bilateral thalami and bilateral cerebellar hemispheres. Background mild generalized parenchymal atrophy and chronic small vessel ischemic disease, stable as compared to the brain MRI of 02/09/2019. Electronically Signed   By: Kellie Simmering DO   On: 07/16/2020 19:20   CT Abdomen Pelvis W Contrast  Result Date: 07/16/2020 CLINICAL DATA:  Abdominal abscess, generalized weakness EXAM: CT ABDOMEN AND PELVIS WITH  CONTRAST TECHNIQUE: Multidetector CT imaging of the abdomen and pelvis was performed using the standard protocol following bolus administration of intravenous contrast. CONTRAST:  27mL OMNIPAQUE IOHEXOL 350 MG/ML SOLN COMPARISON:  03/26/2019 FINDINGS: Lower chest: Mild bibasilar atelectasis. The visualized heart and pericardium are unremarkable. Small hiatal hernia. Hepatobiliary: Multiple cysts are seen scattered throughout the liver, unchanged from prior examination. The liver is otherwise unremarkable. Gallbladder unremarkable. No intra or extrahepatic biliary ductal dilation. Pancreas:  Unremarkable Spleen: Unremarkable Adrenals/Urinary Tract: The adrenal glands are unremarkable. The kidneys are normal in size and position. 5 mm nonobstructing calculus noted within the interpolar region of the left kidney. The kidneys are otherwise unremarkable. The bladder is unremarkable. Stomach/Bowel: Severe descending and sigmoid colonic diverticulosis. The stomach, small bowel, and large bowel are otherwise unremarkable there is no evidence of obstruction or focal inflammation. The appendix is normal. There is no free intraperitoneal gas or fluid. Vascular/Lymphatic: Extensive aortoiliac atherosclerotic calcification. No aortic aneurysm. No pathologic adenopathy within the abdomen and pelvis. Reproductive: Uterus and bilateral adnexa are unremarkable. Other: Tiny fat containing umbilical hernia.  Rectum unremarkable. Musculoskeletal: Osseous structures are age-appropriate. No lytic or blastic bone lesions. No acute bone abnormality. IMPRESSION: No acute intra-abdominal pathology. No definite radiographic explanation for the patient's reported symptoms. Mild left nonobstructing nephrolithiasis. No urolithiasis. No hydronephrosis. Severe distal colonic diverticulosis without superimposed inflammatory change. Aortic Atherosclerosis (ICD10-I70.0). Electronically Signed   By: Fidela Salisbury MD   On: 07/16/2020 16:26               LOS: 0 days   Courtne Lighty  [facility]   Pager on [email or web address]. If 7PM-7AM, please contact night-coverage at [email or web address]     07/17/2020, 2:32 PM

## 2020-07-17 NOTE — Progress Notes (Signed)
pt recent vital signs is now firing a red mews , ST at 120 with PV and RR at 28, oxyegen stats at 95% on rapid response called. See new orders.

## 2020-07-17 NOTE — NC FL2 (Signed)
[place] LEVEL OF CARE SCREENING TOOL     IDENTIFICATION  Patient Name: Lindsay Chung Birthdate: 01-Dec-1940 Sex: female Admission Date (Current Location): 07/16/2020  [facility] and [place] Number:  Engineering geologist and Address:  [facility], [address] [place], [place] [address]      Provider Number: [id number]  Attending Physician Name and Address:  Jennye Boroughs, MD  Relative Name and Phone Number:  Ravina, Milner (Spouse) [phone]    Current Level of Care: Hospital Recommended Level of Care: [place]inous Prior Approval Number:    Date Approved/Denied:   PASRR Number:    Discharge Plan: SNF    Current Diagnoses: Patient Active Problem List   Diagnosis Date Noted  . Acute respiratory failure with hypoxia ([place]) 07/17/2020  . Sepsis ([place]) 07/16/2020  . Weakness 07/16/2020  . Lewy body dementia with behavioral disturbance ([place]) 07/03/2020  . Hypokalemia 03/27/2019  . Anxiety 03/27/2019  . Dementia without behavioral disturbance ([place]) 03/27/2019  . Hypocalcemia 03/26/2019  . Hypomagnesemia   . Nausea without vomiting   . Poor appetite   . Diarrhea     Orientation RESPIRATION BLADDER Height & Weight     Self,Situation,Place  Normal Incontinent Weight: 68 kg Height:  5\' 3"  (160 cm)  BEHAVIORAL SYMPTOMS/MOOD NEUROLOGICAL BOWEL NUTRITION STATUS      Incontinent    AMBULATORY STATUS COMMUNICATION OF NEEDS Skin   Extensive Assist Verbally Normal                       Personal Care Assistance Level of Assistance  Bathing,Dressing,Feeding Bathing Assistance: Maximum assistance Feeding assistance: Limited assistance Dressing Assistance: Maximum assistance Total Care Assistance: Limited assistance   Functional Limitations Info  Sight,Hearing,Speech   Hearing Info: Adequate Speech Info: Adequate    SPECIAL CARE FACTORS FREQUENCY  OT (By licensed OT),PT (By licensed PT)     PT Frequency:  min 5week OT Frequency: min 5xweek            Contractures      Additional Factors Info                  Current Medications (07/17/2020):  This is the current hospital active medication list Current Facility-Administered Medications  Medication Dose Route Frequency Provider Last Rate Last Admin  . acetaminophen (TYLENOL) tablet 650 mg  650 mg Oral Q6H PRN Cox, Amy N, DO       Or  . acetaminophen (TYLENOL) suppository 650 mg  650 mg Rectal Q6H PRN Cox, Amy N, DO      . ALPRAZolam Duanne Moron) tablet 0.5 mg  0.5 mg Oral TID PRN Cox, Amy N, DO      . aspirin chewable tablet 81 mg  81 mg Oral Daily Cox, Amy N, DO   81 mg at 07/17/20 0918  . busPIRone (BUSPAR) tablet 15 mg  15 mg Oral TID Cox, Amy N, DO   15 mg at 07/17/20 1350  . ceFEPIme (MAXIPIME) 2 g in sodium chloride 0.9 % 100 mL IVPB  2 g Intravenous Q12H Berta Minor, RPH 200 mL/hr at 07/17/20 1346 2 g at 07/17/20 1346  . dextromethorphan-guaiFENesin (MUCINEX DM) 30-600 MG per 12 hr tablet 1 tablet  1 tablet Oral BID Cox, Amy N, DO   1 tablet at 07/17/20 0924  . donepezil (ARICEPT) tablet 5 mg  5 mg Oral QHS Cox, Amy N, DO      . enoxaparin (  LOVENOX) injection 40 mg  40 mg Subcutaneous Q24H Cox, Amy N, DO   40 mg at 07/16/20 2252  . FLUoxetine (PROZAC) capsule 20 mg  20 mg Oral BID Cox, Amy N, DO   20 mg at 07/17/20 0923  . ipratropium-albuterol (DUONEB) 0.5-2.5 (3) MG/3ML nebulizer solution 3 mL  3 mL Nebulization Q4H PRN Mansy, Jan A, MD      . labetalol (NORMODYNE) injection 20 mg  20 mg Intravenous Q3H PRN Mansy, Jan A, MD      . loratadine (CLARITIN) tablet 10 mg  10 mg Oral Daily Cox, Amy N, DO   10 mg at 07/17/20 0918  . memantine (NAMENDA) tablet 5 mg  5 mg Oral BID Cox, Amy N, DO   5 mg at 07/17/20 1660  . metoprolol tartrate (LOPRESSOR) tablet 25 mg  25 mg Oral BID Cox, Amy N, DO   25 mg at 07/17/20 0918  . metroNIDAZOLE (FLAGYL) IVPB 500 mg  500 mg Intravenous Q8H Cox, Amy N, DO 100 mL/hr at 07/17/20 1540 500 mg at  07/17/20 1540  . morphine 2 MG/ML injection 1 mg  1 mg Intravenous Once Mansy, Jan A, MD      . multivitamin with minerals tablet 1 tablet  1 tablet Oral Daily Cox, Amy N, DO   1 tablet at 07/17/20 0918  . ondansetron (ZOFRAN) tablet 4 mg  4 mg Oral Q6H PRN Cox, Amy N, DO       Or  . ondansetron (ZOFRAN) injection 4 mg  4 mg Intravenous Q6H PRN Cox, Amy N, DO      . potassium chloride SA (KLOR-CON) CR tablet 40 mEq  40 mEq Oral Lula Olszewski, MD   40 mEq at 07/17/20 1349  . pravastatin (PRAVACHOL) tablet 20 mg  20 mg Oral q1800 Cox, Amy N, DO      . risperiDONE (RISPERDAL) tablet 0.5 mg  0.5 mg Oral BID Cox, Amy N, DO   0.5 mg at 07/17/20 0923  . vancomycin (VANCOCIN) 750 mg in sodium chloride 0.9 % 250 mL IVPB  750 mg Intravenous Q24H Jennye Boroughs, MD      . vitamin B-12 (CYANOCOBALAMIN) tablet 1,000 mcg  1,000 mcg Oral Daily Cox, Amy N, DO   1,000 mcg at 07/17/20 0919     Discharge Medications: Please see discharge summary for a list of discharge medications.  Relevant Imaging Results:  Relevant Lab Results:   Additional Information SS# [id number]  Anselm Pancoast, RN

## 2020-07-18 DIAGNOSIS — A419 Sepsis, unspecified organism: Principal | ICD-10-CM

## 2020-07-18 DIAGNOSIS — R6520 Severe sepsis without septic shock: Secondary | ICD-10-CM

## 2020-07-18 DIAGNOSIS — J189 Pneumonia, unspecified organism: Secondary | ICD-10-CM | POA: Diagnosis present

## 2020-07-18 LAB — BASIC METABOLIC PANEL
Anion gap: 7 (ref 5–15)
BUN: 33 mg/dL — ABNORMAL HIGH (ref 8–23)
CO2: 21 mmol/L — ABNORMAL LOW (ref 22–32)
Calcium: 8.2 mg/dL — ABNORMAL LOW (ref 8.9–10.3)
Chloride: 106 mmol/L (ref 98–111)
Creatinine, Ser: 1.1 mg/dL — ABNORMAL HIGH (ref 0.44–1.00)
GFR, Estimated: 51 mL/min — ABNORMAL LOW (ref >60)
Glucose, Bld: 109 mg/dL — ABNORMAL HIGH (ref 70–99)
Potassium: 3.5 mmol/L (ref 3.5–5.1)
Sodium: 134 mmol/L — ABNORMAL LOW (ref 135–145)

## 2020-07-18 LAB — CBC WITH DIFFERENTIAL/PLATELET
Abs Immature Granulocytes: 0.07 10*3/uL (ref 0.00–0.07)
Basophils Absolute: 0 10*3/uL (ref 0.0–0.1)
Basophils Relative: 0 %
Eosinophils Absolute: 0.1 10*3/uL (ref 0.0–0.5)
Eosinophils Relative: 1 %
HCT: 34.7 % — ABNORMAL LOW (ref 36.0–46.0)
Hemoglobin: 11.9 g/dL — ABNORMAL LOW (ref 12.0–15.0)
Immature Granulocytes: 1 %
Lymphocytes Relative: 8 %
Lymphs Abs: 0.7 10*3/uL (ref 0.7–4.0)
MCH: 29.3 pg (ref 26.0–34.0)
MCHC: 34.3 g/dL (ref 30.0–36.0)
MCV: 85.5 fL (ref 80.0–100.0)
Monocytes Absolute: 0.7 10*3/uL (ref 0.1–1.0)
Monocytes Relative: 8 %
Neutro Abs: 7 10*3/uL (ref 1.7–7.7)
Neutrophils Relative %: 82 %
Platelets: 154 10*3/uL (ref 150–400)
RBC: 4.06 MIL/uL (ref 3.87–5.11)
RDW: 13.3 % (ref 11.5–15.5)
WBC: 8.5 10*3/uL (ref 4.0–10.5)
nRBC: 0 % (ref 0.0–0.2)

## 2020-07-18 LAB — PROCALCITONIN: Procalcitonin: 2.55 ng/mL

## 2020-07-18 MED ORDER — CEFDINIR 300 MG PO CAPS
300.0000 mg | ORAL_CAPSULE | Freq: Two times a day (BID) | ORAL | Status: DC
  Administered 2020-07-18 – 2020-07-20 (×5): 300 mg via ORAL
  Filled 2020-07-18 (×6): qty 1

## 2020-07-18 MED ORDER — POTASSIUM CHLORIDE CRYS ER 20 MEQ PO TBCR
40.0000 meq | EXTENDED_RELEASE_TABLET | Freq: Once | ORAL | Status: AC
  Administered 2020-07-18: 40 meq via ORAL
  Filled 2020-07-18: qty 2

## 2020-07-18 MED ORDER — MAGNESIUM SULFATE 4 GM/100ML IV SOLN
4.0000 g | Freq: Once | INTRAVENOUS | Status: AC
  Administered 2020-07-18: 4 g via INTRAVENOUS
  Filled 2020-07-18: qty 100

## 2020-07-18 MED ORDER — AZITHROMYCIN 500 MG PO TABS
500.0000 mg | ORAL_TABLET | Freq: Every day | ORAL | Status: AC
  Administered 2020-07-18 – 2020-07-20 (×3): 500 mg via ORAL
  Filled 2020-07-18 (×3): qty 1

## 2020-07-18 NOTE — Progress Notes (Addendum)
Progress Note    LAKISHIA BOURASSA  CLE:[id number] DOB: [birth year]  DOA: 07/16/2020 PCP: Tracie Harrier, MD      Brief Narrative:    Medical records reviewed and are as summarized below:  [name] is a 80 y.o. female with past medical history significant for Lewy body dementia, hyperlipidemia, depression, anxiety, GERD, hypertension, was brought to the hospital because of lethargy.  She was hypoxic and was requiring up to 6 L of Oxygen via nasal cannula..  She was admitted to the hospital for severe sepsis secondary to pneumonia complicated by acute metabolic encephalopathy and acute hypoxic respiratory failure.  She was treated with empiric antibiotics and IV fluids.  Her condition has improved.  PT and OT recommended discharge to SNF.    Assessment/Plan:   Principal Problem:   Sepsis ([place]) Active Problems:   Anxiety   Dementia without behavioral disturbance (HCC)   Lewy body dementia with behavioral disturbance (HCC)   Weakness   Acute respiratory failure with hypoxia (HCC)   Bilateral pneumonia   Body mass index is 26.56 kg/m.    Severe sepsis secondary to pneumonia: Discontinue IV antibiotics.  Start Omnicef and oral azithromycin.  No growth on blood cultures thus far.  Acute hypoxic respiratory failure: Resolved.  She is tolerating room air.  Acute metabolic encephalopathy/lethargy: Resolved  Lewy body dementia: Continue Aricept and Namenda.  Supportive care.  Hyponatremia: Improving.  Hypokalemia and hypomagnesemia: Replete potassium and magnesium respectively.  Monitor levels.  Other comorbidities include anxiety, depression, hypertension and hyperlipidemia.   Diet Order            DIET DYS 3 Room service appropriate? Yes; Fluid consistency: Thin  Diet effective now                    Consultants:  None  Procedures:  None    Medications:   . aspirin  81 mg Oral Daily  . azithromycin  500 mg Oral Daily  . busPIRone  15 mg  Oral TID  . cefdinir  300 mg Oral Q12H  . dextromethorphan-guaiFENesin  1 tablet Oral BID  . donepezil  5 mg Oral QHS  . enoxaparin (LOVENOX) injection  40 mg Subcutaneous Q24H  . FLUoxetine  20 mg Oral BID  . loratadine  10 mg Oral Daily  . memantine  5 mg Oral BID  . metoprolol tartrate  25 mg Oral BID  .  morphine injection  1 mg Intravenous Once  . multivitamin with minerals  1 tablet Oral Daily  . potassium chloride  40 mEq Oral Once  . pravastatin  20 mg Oral q1800  . risperiDONE  0.5 mg Oral BID  . vitamin B-12  1,000 mcg Oral Daily   Continuous Infusions: . magnesium sulfate bolus IVPB       Anti-infectives (From admission, onward)   Start     Dose/Rate Route Frequency Ordered Stop   07/18/20 1315  cefdinir (OMNICEF) capsule 300 mg        300 mg Oral Every 12 hours 07/18/20 1228     07/18/20 1245  azithromycin (ZITHROMAX) tablet 500 mg        500 mg Oral Daily 07/18/20 1156 07/21/20 0959   07/18/20 0000  vancomycin (VANCOREADY) IVPB 750 mg/150 mL  Status:  Discontinued        750 mg 150 mL/hr over 60 Minutes Intravenous Every 24 hours 07/17/20 2310 07/18/20 1156   07/17/20 1400  vancomycin (VANCOREADY) IVPB  1000 mg/200 mL  Status:  Discontinued        1,000 mg 200 mL/hr over 60 Minutes Intravenous Every 24 hours 07/16/20 2056 07/17/20 1217   07/17/20 1400  vancomycin (VANCOCIN) 750 mg in sodium chloride 0.9 % 250 mL IVPB  Status:  Discontinued        750 mg 250 mL/hr over 60 Minutes Intravenous Every 24 hours 07/17/20 1217 07/17/20 2310   07/17/20 0000  ceFEPIme (MAXIPIME) 2 g in sodium chloride 0.9 % 100 mL IVPB  Status:  Discontinued        2 g 200 mL/hr over 30 Minutes Intravenous Every 12 hours 07/16/20 1804 07/18/20 1156   07/16/20 1800  metroNIDAZOLE (FLAGYL) IVPB 500 mg  Status:  Discontinued        500 mg 100 mL/hr over 60 Minutes Intravenous Every 8 hours 07/16/20 1736 07/18/20 1156   07/16/20 1800  vancomycin (VANCOREADY) IVPB 500 mg/100 mL        500  mg 100 mL/hr over 60 Minutes Intravenous  Once 07/16/20 1759 07/16/20 2345   07/16/20 1245  metroNIDAZOLE (FLAGYL) IVPB 500 mg        500 mg 100 mL/hr over 60 Minutes Intravenous  Once 07/16/20 1233 07/16/20 1345   07/16/20 1245  vancomycin (VANCOCIN) IVPB 1000 mg/200 mL premix        1,000 mg 200 mL/hr over 60 Minutes Intravenous  Once 07/16/20 1233 07/16/20 1446   07/16/20 1245  ceFEPIme (MAXIPIME) 2 g in sodium chloride 0.9 % 100 mL IVPB        2 g 200 mL/hr over 30 Minutes Intravenous  Once 07/16/20 1233 07/16/20 1345   07/16/20 1215  ceFEPIme (MAXIPIME) 2 g in sodium chloride 0.9 % 100 mL IVPB  Status:  Discontinued        2 g 200 mL/hr over 30 Minutes Intravenous  Once 07/16/20 1204 07/16/20 1220   07/16/20 1215  metroNIDAZOLE (FLAGYL) IVPB 500 mg  Status:  Discontinued        500 mg 100 mL/hr over 60 Minutes Intravenous  Once 07/16/20 1204 07/16/20 1220   07/16/20 1215  vancomycin (VANCOCIN) IVPB 1000 mg/200 mL premix  Status:  Discontinued        1,000 mg 200 mL/hr over 60 Minutes Intravenous  Once 07/16/20 1204 07/16/20 1220             Family Communication/Anticipated D/C date and plan/Code Status   DVT prophylaxis: enoxaparin (LOVENOX) injection 40 mg Start: 07/16/20 2200 Place TED hose Start: 07/16/20 1734     Code Status: DNR  Family Communication: Her husband over the phone Disposition Plan:    Status is: Inpatient  Remains inpatient appropriate because:Inpatient level of care appropriate due to severity of illness   Dispo: The patient is from: Home              Anticipated d/c is to: Home              Patient currently is not medically stable to d/c.   Difficult to place patient No                Subjective:   No shortness of breath or chest pain.  No vomiting, abdominal pain or diarrhea.  She feels much better.  Objective:    Vitals:   07/17/20 0736 07/17/20 1546 07/17/20 1933 07/18/20 0019  BP: (!) 145/86 123/68 113/66 123/68   Pulse: 97 95 75 80  Resp: 15 14 16  17  Temp: 98.1 F (36.7 C) 98.7 F (37.1 C) 98 F (36.7 C) 98 F (36.7 C)  TempSrc: Oral Oral Oral Oral  SpO2: 97% 95% 98% 95%  Weight:      Height:       No data found.   Intake/Output Summary (Last 24 hours) at 07/18/2020 1232 Last data filed at 07/18/2020 0604 Gross per 24 hour  Intake 100 ml  Output 800 ml  Net -700 ml   Filed Weights   07/16/20 1211  Weight: 68 kg    Exam:  GEN: NAD SKIN: No rash EYES: EOMI ENT: MMM CV: RRR PULM: CTA B ABD: soft, ND, NT, +BS CNS: AAO x 3, chronic left facial droop EXT: No edema or tenderness        Data Reviewed:   I have personally reviewed following labs and imaging studies:  Labs: Labs show the following:   Basic Metabolic Panel: Recent Labs  Lab 07/11/20 1439 07/16/20 1212 07/17/20 0307 07/17/20 0833 07/18/20 0411  NA 135 132*  --  130* 134*  K 3.7 3.7  --  3.2* 3.5  CL 100 97*  --  98 106  CO2 26 24  --  18* 21*  GLUCOSE 122* 110*  --  147* 109*  BUN 17 24*  --  23 33*  CREATININE 0.94 1.10*  --  1.26* 1.10*  CALCIUM 8.8* 8.1*  --  7.9* 8.2*  MG  --   --  1.3*  --   --   PHOS  --   --  3.5  --   --    GFR Estimated Creatinine Clearance: 38.4 mL/min (A) (by C-G formula based on SCr of 1.1 mg/dL (H)). Liver Function Tests: Recent Labs  Lab 07/16/20 1212  AST 179*  ALT 65*  ALKPHOS 79  BILITOT 0.8  PROT 6.3*  ALBUMIN 3.0*   No results for input(s): LIPASE, AMYLASE in the last 168 hours. No results for input(s): AMMONIA in the last 168 hours. Coagulation profile Recent Labs  Lab 07/16/20 1212  INR 1.2    CBC: Recent Labs  Lab 07/11/20 1439 07/16/20 1212 07/18/20 0411  WBC 6.4 8.1 8.5  NEUTROABS  --  6.5 7.0  HGB 12.4 12.3 11.9*  HCT 37.5 36.6 34.7*  MCV 87.8 86.9 85.5  PLT 198 180 154   Cardiac Enzymes: No results for input(s): CKTOTAL, CKMB, CKMBINDEX, TROPONINI in the last 168 hours. BNP (last 3 results) No results for input(s):  PROBNP in the last 8760 hours. CBG: No results for input(s): GLUCAP in the last 168 hours. D-Dimer: No results for input(s): DDIMER in the last 72 hours. Hgb A1c: No results for input(s): HGBA1C in the last 72 hours. Lipid Profile: No results for input(s): CHOL, HDL, LDLCALC, TRIG, CHOLHDL, LDLDIRECT in the last 72 hours. Thyroid function studies: No results for input(s): TSH, T4TOTAL, T3FREE, THYROIDAB in the last 72 hours.  Invalid input(s): FREET3 Anemia work up: Recent Labs    07/17/20 [place] 597   Sepsis Labs: Recent Labs  Lab 07/11/20 1439 07/16/20 1212 07/17/20 0307 07/18/20 0411  PROCALCITON  --  0.11 0.26 2.55  WBC 6.4 8.1  --  8.5  LATICACIDVEN  --  0.9  --   --     Microbiology Recent Results (from the past 240 hour(s))  Resp Panel by RT-PCR (Flu A&B, Covid) Nasopharyngeal Swab     Status: None   Collection Time: 07/11/20  3:53 PM   Specimen:  Nasopharyngeal Swab; Nasopharyngeal(NP) swabs in vial transport medium  Result Value Ref Range Status   SARS Coronavirus 2 by RT PCR NEGATIVE NEGATIVE Final    Comment: (NOTE) SARS-CoV-2 target nucleic acids are NOT DETECTED.  The SARS-CoV-2 RNA is generally detectable in upper respiratory specimens during the acute phase of infection. The lowest concentration of SARS-CoV-2 viral copies this assay can detect is 138 copies/mL. A negative result does not preclude SARS-Cov-2 infection and should not be used as the sole basis for treatment or other patient management decisions. A negative result may occur with  improper specimen collection/handling, submission of specimen other than nasopharyngeal swab, presence of viral mutation(s) within the areas targeted by this assay, and inadequate number of viral copies(<138 copies/mL). A negative result must be combined with clinical observations, patient history, and epidemiological information. The expected result is Negative.  Fact Sheet for Patients:   EntrepreneurPulse.com.au  Fact Sheet for Healthcare Providers:  IncredibleEmployment.be  This test is no t yet approved or cleared by the [place] FDA and  has been authorized for detection and/or diagnosis of SARS-CoV-2 by FDA under an Emergency Use Authorization (EUA). This EUA will remain  in effect (meaning this test can be used) for the duration of the COVID-19 declaration under Section 564(b)(1) of the Act, 21 U.S.C.section 360bbb-3(b)(1), unless the authorization is terminated  or revoked sooner.       Influenza A by PCR NEGATIVE NEGATIVE Final   Influenza B by PCR NEGATIVE NEGATIVE Final    Comment: (NOTE) The Xpert Xpress SARS-CoV-2/FLU/RSV plus assay is intended as an aid in the diagnosis of influenza from Nasopharyngeal swab specimens and should not be used as a sole basis for treatment. Nasal washings and aspirates are unacceptable for Xpert Xpress SARS-CoV-2/FLU/RSV testing.  Fact Sheet for Patients: EntrepreneurPulse.com.au  Fact Sheet for Healthcare Providers: IncredibleEmployment.be  This test is not yet approved or cleared by the [place] FDA and has been authorized for detection and/or diagnosis of SARS-CoV-2 by FDA under an Emergency Use Authorization (EUA). This EUA will remain in effect (meaning this test can be used) for the duration of the COVID-19 declaration under Section 564(b)(1) of the Act, 21 U.S.C. section 360bbb-3(b)(1), unless the authorization is terminated or revoked.  Performed at [facility], [address] [place], [place] [address]   Resp Panel by RT-PCR (Flu A&B, Covid) Nasopharyngeal Swab     Status: None   Collection Time: 07/16/20 12:12 PM   Specimen: Nasopharyngeal Swab; Nasopharyngeal(NP) swabs in vial transport medium  Result Value Ref Range Status   SARS Coronavirus 2 by RT PCR NEGATIVE NEGATIVE Final    Comment: (NOTE) SARS-CoV-2  target nucleic acids are NOT DETECTED.  The SARS-CoV-2 RNA is generally detectable in upper respiratory specimens during the acute phase of infection. The lowest concentration of SARS-CoV-2 viral copies this assay can detect is 138 copies/mL. A negative result does not preclude SARS-Cov-2 infection and should not be used as the sole basis for treatment or other patient management decisions. A negative result may occur with  improper specimen collection/handling, submission of specimen other than nasopharyngeal swab, presence of viral mutation(s) within the areas targeted by this assay, and inadequate number of viral copies(<138 copies/mL). A negative result must be combined with clinical observations, patient history, and epidemiological information. The expected result is Negative.  Fact Sheet for Patients:  EntrepreneurPulse.com.au  Fact Sheet for Healthcare Providers:  IncredibleEmployment.be  This test is no t yet approved or cleared by the [place]  FDA and  has been authorized for detection and/or diagnosis of SARS-CoV-2 by FDA under an Emergency Use Authorization (EUA). This EUA will remain  in effect (meaning this test can be used) for the duration of the COVID-19 declaration under Section 564(b)(1) of the Act, 21 U.S.C.section 360bbb-3(b)(1), unless the authorization is terminated  or revoked sooner.       Influenza A by PCR NEGATIVE NEGATIVE Final   Influenza B by PCR NEGATIVE NEGATIVE Final    Comment: (NOTE) The Xpert Xpress SARS-CoV-2/FLU/RSV plus assay is intended as an aid in the diagnosis of influenza from Nasopharyngeal swab specimens and should not be used as a sole basis for treatment. Nasal washings and aspirates are unacceptable for Xpert Xpress SARS-CoV-2/FLU/RSV testing.  Fact Sheet for Patients: EntrepreneurPulse.com.au  Fact Sheet for Healthcare  Providers: IncredibleEmployment.be  This test is not yet approved or cleared by the [place] FDA and has been authorized for detection and/or diagnosis of SARS-CoV-2 by FDA under an Emergency Use Authorization (EUA). This EUA will remain in effect (meaning this test can be used) for the duration of the COVID-19 declaration under Section 564(b)(1) of the Act, 21 U.S.C. section 360bbb-3(b)(1), unless the authorization is terminated or revoked.  Performed at [facility], [address], [place], [place] [address]   Blood Culture (routine x 2)     Status: None (Preliminary result)   Collection Time: 07/16/20 12:12 PM   Specimen: BLOOD  Result Value Ref Range Status   Specimen Description   Final    BLOOD Blood Culture results may not be optimal due to an inadequate volume of blood received in culture bottles   Special Requests   Final    BOTTLES DRAWN AEROBIC AND ANAEROBIC RIGHT ANTECUBITAL   Culture   Final    NO GROWTH 2 DAYS Performed at [facility], [address] [place], [place] [address]    Report Status PENDING  Incomplete  Blood Culture (routine x 2)     Status: None (Preliminary result)   Collection Time: 07/16/20 12:12 PM   Specimen: BLOOD  Result Value Ref Range Status   Specimen Description   Final    BLOOD Blood Culture results may not be optimal due to an inadequate volume of blood received in culture bottles   Special Requests   Final    BOTTLES DRAWN AEROBIC AND ANAEROBIC BLOOD LEFT HAND   Culture   Final    NO GROWTH 2 DAYS Performed at [facility], [address] [place], [place] [address]    Report Status PENDING  Incomplete  Urine culture     Status: None   Collection Time: 07/16/20 12:12 PM   Specimen: Urine, Random  Result Value Ref Range Status   Specimen Description   Final    URINE, RANDOM Performed at [facility], [address] [place], [place] [address]    Special Requests    Final    NONE Performed at [facility], [address], [place], [place] [address]    Culture   Final    NO GROWTH Performed at [facility], [place] [address] [place], [place] [address]    Report Status 07/17/2020 FINAL  Final    Procedures and diagnostic studies:  DG Chest 2 View  Result Date: 07/16/2020 CLINICAL DATA:  Questionable sepsis. EXAM: CHEST - 2 VIEW COMPARISON:  July 02, 2020 FINDINGS: The heart size and mediastinal contours are stable. Heart size is mildly enlarged. Mild  increased pulmonary interstitium is identified bilaterally. No focal pneumonia or pleural effusion is noted. The visualized skeletal structures are unremarkable. IMPRESSION: Mild congestive heart failure. Electronically Signed   By: Abelardo Diesel M.D.   On: 07/16/2020 14:08   CT Head Wo Contrast  Result Date: 07/16/2020 CLINICAL DATA:  Altered mental status. EXAM: CT HEAD WITHOUT CONTRAST TECHNIQUE: Contiguous axial images were obtained from the base of the skull through the vertex without intravenous contrast. COMPARISON:  July 11, 2020 FINDINGS: Brain: No evidence of acute infarction, hemorrhage, hydrocephalus, extra-axial collection or mass lesion/mass effect. There is chronic diffuse atrophy. Chronic bilateral periventricular white matter small vessel ischemic changes are noted. Vascular: No hyperdense vessel noted. Skull: Normal. Negative for fracture or focal lesion. Sinuses/Orbits: No acute finding. Other: None. IMPRESSION: 1. No focal acute intracranial abnormality identified. 2. Chronic diffuse atrophy. Chronic bilateral periventricular white matter small vessel ischemic change. Electronically Signed   By: Abelardo Diesel M.D.   On: 07/16/2020 14:01   CT CHEST WO CONTRAST  Result Date: 07/17/2020 CLINICAL DATA:  Respiratory failure, lethargy, increased work of breathing. EXAM: CT CHEST WITHOUT CONTRAST TECHNIQUE: Multidetector CT imaging of the chest was performed following the standard  protocol without IV contrast. COMPARISON:  07/02/2020. FINDINGS: Cardiovascular: Atherosclerotic calcification of the aorta, aortic valve and coronary arteries. Pulmonic trunk and heart are enlarged. No pericardial effusion. Mediastinum/Nodes: No pathologically enlarged mediastinal or axillary lymph nodes. Hilar regions are difficult to definitively evaluate without IV contrast. Esophagus is grossly unremarkable. Lungs/Pleura: Image quality is rather degraded by expiratory phase imaging and respiratory motion. New patchy areas of ground-glass bilaterally with dependent atelectasis. Associated smooth septal thickening. No pleural fluid. Debris is seen in the airway. Upper Abdomen: Low-attenuation lesions in the liver measure up to 1.6 cm in the dome, as before. Further characterization is limited without post-contrast imaging or due to size. Visualized portions of the liver and gallbladder are otherwise unremarkable. Nodular thickening of the right adrenal gland. Adreniform thickening of the left adrenal gland. Stones in the kidneys bilaterally. There may be a subcentimeter exophytic lesion off the interpolar left kidney, incompletely visualized. Visualized portions of the kidneys, spleen, pancreas, stomach and bowel are otherwise unremarkable. No upper abdominal adenopathy. Musculoskeletal: Degenerative changes in the spine and shoulders. IMPRESSION: 1. Image quality is degraded by expiratory phase imaging and respiratory motion. Mild pulmonary edema. Difficult to definitively exclude pneumonia. 2. Bilateral renal stones. 3. Aortic atherosclerosis (ICD10-I70.0). Coronary artery calcification. 4. Enlarged pulmonic trunk, indicative of pulmonary arterial hypertension. Electronically Signed   By: Lorin Picket M.D.   On: 07/17/2020 13:36   MR BRAIN WO CONTRAST  Result Date: 07/16/2020 CLINICAL DATA:  Neuro deficit, acute, stroke suspected. Additional history provided: Weakness. EXAM: MRI HEAD WITHOUT CONTRAST  TECHNIQUE: Multiplanar, multiecho pulse sequences of the brain and surrounding structures were obtained without intravenous contrast. COMPARISON:  Prior head CT examinations 07/16/2020 and earlier. Brain MRI 02/09/2019. FINDINGS: Brain: Mild cerebral and cerebellar atrophy. Redemonstrated chronic lacunar infarcts within the right caudate head, bilateral thalami and bilateral cerebellar hemispheres. Background mild multifocal T2/FLAIR hyperintensity within the cerebral white matter and within the pons, nonspecific but compatible with chronic small vessel ischemic disease. There is no acute infarct. No evidence of intracranial mass. No chronic intracranial blood products. No extra-axial fluid collection. No midline shift. Vascular: Expected proximal arterial flow voids. Skull and upper cervical spine: No focal marrow lesion. Sinuses/Orbits: Visualized orbits show no acute finding. No significant paranasal sinus disease. IMPRESSION: No evidence of acute intracranial abnormality.  Redemonstrated chronic lacunar infarcts within right caudate head, bilateral thalami and bilateral cerebellar hemispheres. Background mild generalized parenchymal atrophy and chronic small vessel ischemic disease, stable as compared to the brain MRI of 02/09/2019. Electronically Signed   By: Kellie Simmering DO   On: 07/16/2020 19:20   CT Abdomen Pelvis W Contrast  Result Date: 07/16/2020 CLINICAL DATA:  Abdominal abscess, generalized weakness EXAM: CT ABDOMEN AND PELVIS WITH CONTRAST TECHNIQUE: Multidetector CT imaging of the abdomen and pelvis was performed using the standard protocol following bolus administration of intravenous contrast. CONTRAST:  26mL OMNIPAQUE IOHEXOL 350 MG/ML SOLN COMPARISON:  03/26/2019 FINDINGS: Lower chest: Mild bibasilar atelectasis. The visualized heart and pericardium are unremarkable. Small hiatal hernia. Hepatobiliary: Multiple cysts are seen scattered throughout the liver, unchanged from prior examination. The  liver is otherwise unremarkable. Gallbladder unremarkable. No intra or extrahepatic biliary ductal dilation. Pancreas: Unremarkable Spleen: Unremarkable Adrenals/Urinary Tract: The adrenal glands are unremarkable. The kidneys are normal in size and position. 5 mm nonobstructing calculus noted within the interpolar region of the left kidney. The kidneys are otherwise unremarkable. The bladder is unremarkable. Stomach/Bowel: Severe descending and sigmoid colonic diverticulosis. The stomach, small bowel, and large bowel are otherwise unremarkable there is no evidence of obstruction or focal inflammation. The appendix is normal. There is no free intraperitoneal gas or fluid. Vascular/Lymphatic: Extensive aortoiliac atherosclerotic calcification. No aortic aneurysm. No pathologic adenopathy within the abdomen and pelvis. Reproductive: Uterus and bilateral adnexa are unremarkable. Other: Tiny fat containing umbilical hernia.  Rectum unremarkable. Musculoskeletal: Osseous structures are age-appropriate. No lytic or blastic bone lesions. No acute bone abnormality. IMPRESSION: No acute intra-abdominal pathology. No definite radiographic explanation for the patient's reported symptoms. Mild left nonobstructing nephrolithiasis. No urolithiasis. No hydronephrosis. Severe distal colonic diverticulosis without superimposed inflammatory change. Aortic Atherosclerosis (ICD10-I70.0). Electronically Signed   By: Fidela Salisbury MD   On: 07/16/2020 16:26               LOS: 1 day   Rache Klimaszewski  [facility]   Pager on [email or web address]. If 7PM-7AM, please contact night-coverage at [email or web address]     07/18/2020, 12:32 PM

## 2020-07-18 NOTE — Progress Notes (Signed)
Clinical/Bedside Swallow Evaluation Patient Details  Name: Lindsay Chung MRN: [id number] Date of Birth: July 14, 1940  Today's Date: 07/18/2020 Time: SLP Start Time (ACUTE ONLY): 61 SLP Stop Time (ACUTE ONLY): 1042 SLP Time Calculation (min) (ACUTE ONLY): 22 min  Past Medical History:  Past Medical History:  Diagnosis Date   Anxiety    Fibromyalgia    GERD (gastroesophageal reflux disease)    Hyperlipidemia    Hypertension    Irritable bowel syndrome    Lewy body dementia ([place])    Memory difficulty    Past Surgical History:  Past Surgical History:  Procedure Laterality Date   BREAST BIOPSY Left    neg   BREAST BIOPSY Left 06/16/2018   affirm stereo biopsy/ x clip/ path pending   CATARACT EXTRACTION W/PHACO Left 02/21/2020   Procedure: CATARACT EXTRACTION PHACO AND INTRAOCULAR LENS PLACEMENT ([place]) LEFT;  Surgeon: Eulogio Bear, MD;  Location: [place];  Service: Ophthalmology;  Laterality: Left;  6.37 0:40.0   CATARACT EXTRACTION W/PHACO Right 03/13/2020   Procedure: CATARACT EXTRACTION PHACO AND INTRAOCULAR LENS PLACEMENT ([facility]) RIGHT;  Surgeon: Eulogio Bear, MD;  Location: [place];  Service: Ophthalmology;  Laterality: Right;  6.72 0:42.2   COLONOSCOPY W/ POLYPECTOMY     COLONOSCOPY WITH PROPOFOL N/A 10/21/2014   Procedure: COLONOSCOPY WITH PROPOFOL;  Surgeon: Manya Silvas, MD;  Location: [facility] END[facility]COPY;  Service: Endoscopy;  Laterality: N/A;   ESOPHAGOGASTRODUODENOSCOPY     GYNECOLOGIC CRYOSURGERY     TUBAL LIGATION     HPI:  80yo female admitted 07/16/20 with lethargy and weakness. PMH: Lewy Body Dementia, HLD, HTN, depression, anxiety, psychiatric imbalance, GERD, fibromyalgia, IBS. MRI = no acute abnormality. 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 CXR = mild CHF. CT chest 07/17/20 showed " mild  pulmonary edema, difficult to definitively exclude pneumonia."   Assessment / Plan / Recommendation Clinical Impression   Patient presents with mild oral dysphagia characterized by mild left facial weakness contributing to impaired mastication/ bolus clearance with solids. History of Bell's Palsy is documented in Care Everywhere; remote infarcts also noted on MRI. Patient alert, oriented x3 and following commands. She is confused about the date but aware she is in the hospital. She denies swallowing difficulties. She was able to self-feed with setup assistance and extended time; reported she was not hungry and so did not consume much solid food. Consumed thin liquids via cup and straw with appearance of adequate oral containment, transfer and timely swallow initiation, with no overt signs of aspiration noted even with consecutive straw sips. For puree and softened solid, pt with mildly prolonged oral phase, mild L buccal solid residue which pt cleared with cue for lingual sweep. Subsequently she implemented this maneuver for her next bite without cues. Recommend initiating dysphagia 3, thin liquids, meds whole in puree. Upright during and 30 minutes after meals due to hx of GERD, to minimize risk for post-prandial aspiration. SLP to follow up for tolerance with meal, will consider advancement at that time. If mentation fluctuates during the day/evening, pt may require assistance for feeding.     SLP Visit Diagnosis: Dysphagia, oral phase (R13.11)    Aspiration Risk  Mild aspiration risk    Diet Recommendation Dysphagia 3 (Mech soft);Thin liquid   Liquid Administration via: Cup;Straw Medication Administration: Whole meds with puree Supervision: Intermittent supervision to cue for compensatory strategies (may need assistance if mentation fluctuates)  Compensations: Slow rate;Small sips/bites;Follow solids with liquid Postural Changes: Seated upright at 90 degrees;Remain upright for at least 30 minutes  after po intake    Other  Recommendations Oral Care Recommendations: Oral care BID   Follow up Recommendations Other (comment) (tbd)      Frequency and Duration min 2x/week  2 weeks       Prognosis Prognosis for Safe Diet Advancement: Good Barriers to Reach Goals: Cognitive deficits      Swallow Study   General Date of Onset: 07/16/20 HPI: 80yo female admitted 07/16/20 with lethargy and weakness. PMH: Lewy Body Dementia, HLD, HTN, depression, anxiety, psychiatric imbalance, GERD, fibromyalgia, IBS. MRI = no acute abnormality. 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 CXR = mild CHF. CT chest 07/17/20 showed " mild pulmonary edema, difficult to definitively exclude pneumonia." Type of Study: Bedside Swallow Evaluation Previous Swallow Assessment: none (EGD several years ago showed reflux per chart review) Diet Prior to this Study: NPO Temperature Spikes Noted: No Respiratory Status: Room air History of Recent Intubation: No Behavior/Cognition: Alert;Cooperative Oral Cavity Assessment: Within Functional Limits Oral Care Completed by SLP: No Oral Cavity - Dentition: Adequate natural dentition Vision: Functional for self-feeding Self-Feeding Abilities: Needs set up;Able to feed self Patient Positioning: Upright in bed Baseline Vocal Quality: Normal Volitional Cough: Strong Volitional Swallow: Able to elicit    Oral/Motor/Sensory Function Overall Oral Motor/Sensory Function: Mild impairment Facial ROM: Reduced left Facial Symmetry: Abnormal symmetry left Facial Strength: Within Functional Limits Facial Sensation: Within Functional Limits Lingual ROM: Within Functional Limits Lingual Symmetry: Within Functional Limits Lingual Strength: Within Functional Limits Velum: Within Functional Limits Mandible: Within Functional Limits   Ice Chips  Ice chips: Within functional limits   Thin Liquid Thin Liquid: Within functional limits Presentation: Cup;Straw;Self Fed    Nectar Thick Nectar Thick Liquid: Not tested   Honey Thick Honey Thick Liquid: Not tested   Puree Puree: Impaired Oral Phase Impairments: Reduced lingual movement/coordination Oral Phase Functional Implications: Prolonged oral transit Pharyngeal Phase Impairments:  (none)   Solid     Solid: Impaired Presentation: Self Fed Oral Phase Impairments: Reduced lingual movement/coordination;Impaired mastication Oral Phase Functional Implications: Prolonged oral transit;Impaired mastication;Oral residue Pharyngeal Phase Impairments: Other (comments) (none)     Deneise Lever, MS, CCC-SLP Speech-Language Pathologist  Aliene Altes 07/18/2020,10:51 AM

## 2020-07-19 DIAGNOSIS — Z7189 Other specified counseling: Secondary | ICD-10-CM

## 2020-07-19 DIAGNOSIS — J189 Pneumonia, unspecified organism: Secondary | ICD-10-CM

## 2020-07-19 DIAGNOSIS — E871 Hypo-osmolality and hyponatremia: Secondary | ICD-10-CM

## 2020-07-19 LAB — BASIC METABOLIC PANEL
Anion gap: 9 (ref 5–15)
BUN: 35 mg/dL — ABNORMAL HIGH (ref 8–23)
CO2: 20 mmol/L — ABNORMAL LOW (ref 22–32)
Calcium: 8.1 mg/dL — ABNORMAL LOW (ref 8.9–10.3)
Chloride: 105 mmol/L (ref 98–111)
Creatinine, Ser: 0.97 mg/dL (ref 0.44–1.00)
GFR, Estimated: 59 mL/min — ABNORMAL LOW (ref >60)
Glucose, Bld: 115 mg/dL — ABNORMAL HIGH (ref 70–99)
Potassium: 3.8 mmol/L (ref 3.5–5.1)
Sodium: 134 mmol/L — ABNORMAL LOW (ref 135–145)

## 2020-07-19 LAB — MAGNESIUM: Magnesium: 2.5 mg/dL — ABNORMAL HIGH (ref 1.7–2.4)

## 2020-07-19 NOTE — TOC Progression Note (Signed)
Transition of Care [facility]) - Progression Note    Patient Details  Name: Lindsay Chung MRN: [id number] Date of Birth: May 07, 1940  Transition of Care [facility]) CM/SW [place], RN Phone Number: 07/19/2020, 9:30 AM  Clinical Narrative:   Reviewed the only bed offer with the patient and she is agreeable, I called her husband Barnabas Lister at [phone] and left a VM to call back, Patient gives the go ahead to start insurance auth,          Expected Discharge Plan and Services                                                 Social Determinants of Health (SDOH) Interventions    Readmission Risk Interventions No flowsheet data found.

## 2020-07-19 NOTE — Progress Notes (Signed)
Physical Therapy Treatment Patient Details Name: Lindsay Chung MRN: [id number] DOB: [birth year] Today's Date: 07/19/2020    History of Present Illness 80 y.o. female with medical history significant for Lewy body dementia, hyperlipidemia, depression, anxiety, GERD, and hypertension, who presents to the emergency department for chief concerns of lethargy.    PT Comments    Patient alert, family at bedside, denied pain. Equipment and POC discussed with pt and family; may benefit from hoyer lift if headed home, but pt and family educated about and agreeable to SNF to maximize pt performance, mobility, and independence. Pt demonstrated some improvement in mobility compared to previous session. Several exercises perform with AAROM and tactile cues as needed. Supine to sit with maxA, fair balance occasionally with CGA, primarily minA. Sit <> Stand with RW and maxAx2, pt stabilizing posterior on bed rails. Able to lift ea foot from the floor, lean forward with assist, but BM noted and returned to supine maxAx2. Bed mobility performed with maxA (pt able to grab bars participate) for pt clean up/bed linen change. The patient would benefit from further skilled PT intervention to continue to progress towards goals. Recommendation remains appropriate.      Follow Up Recommendations  SNF     Equipment Recommendations  None recommended by PT;Other (comment) (hoyer lift if headed home)    Recommendations for Other Services OT consult     Precautions / Restrictions Precautions Precautions: Fall Restrictions Weight Bearing Restrictions: No    Mobility  Bed Mobility Overal bed mobility: Needs Assistance Bed Mobility: Supine to Sit;Sit to Supine     Supine to sit: Max assist Sit to supine: Max assist;+2 for physical assistance;+2 for safety/equipment        Transfers Overall transfer level: Needs assistance Equipment used: Rolling walker (2 wheeled) Transfers: Sit to/from Stand Sit to  Stand: Max assist;+2 physical assistance            Ambulation/Gait             General Gait Details: Pt able to lift ea foot of the floor to simulate ambulation, but BM noted. Returned to supine to clean up   Marine scientist [place] (Stroke Patients Only)       Balance Overall balance assessment: Needs assistance Sitting-balance support: Feet supported;Bilateral upper extremity supported Sitting balance-Leahy Scale: Poor Sitting balance - Comments: CGA-minA to maintain   Standing balance support: Bilateral upper extremity supported;During functional activity Standing balance-Leahy Scale: Poor Standing balance comment: posterior lean noted                            Cognition Arousal/Alertness: Awake/alert Behavior During Therapy: WFL for tasks assessed/performed Overall Cognitive Status: History of cognitive impairments - at baseline                                        Exercises Total Joint Exercises Ankle Circles/Pumps: AROM;Both;Strengthening;10 reps Heel Slides: AROM;AAROM;Right;Both;10 reps Hip ABduction/ADduction: AAROM;Strengthening;Both;10 reps Long Arc Quad: AROM;Strengthening;Both;10 reps    General Comments        Pertinent Vitals/Pain Pain Assessment: No/denies pain    Home Living                      Prior Function  PT Goals (current goals can now be found in the care plan section) Progress towards PT goals: Progressing toward goals    Frequency    Min 2X/week      PT Plan Current plan remains appropriate    Co-evaluation              AM-PAC PT "6 Clicks" Mobility   Outcome Measure  Help needed turning from your back to your side while in a flat bed without using bedrails?: Total Help needed moving from lying on your back to sitting on the side of a flat bed without using bedrails?: Total Help needed moving to and from a bed to  a chair (including a wheelchair)?: Total Help needed standing up from a chair using your arms (e.g., wheelchair or bedside chair)?: Total Help needed to walk in hospital room?: Total Help needed climbing 3-5 steps with a railing? : Total 6 Click Score: 6    End of Session   Activity Tolerance: Patient limited by fatigue Patient left: in bed;with bed alarm set Nurse Communication: Mobility status PT Visit Diagnosis: Unsteadiness on feet (R26.81);Muscle weakness (generalized) (M62.81);Difficulty in walking, not elsewhere classified (R26.2)     Time: 4920-1007 PT Time Calculation (min) (ACUTE ONLY): 32 min  Charges:  $Therapeutic Exercise: 23-37 mins                     Lieutenant Diego PT, DPT 1:07 PM,07/19/20

## 2020-07-19 NOTE — Progress Notes (Signed)
SLP Cancellation Note  Patient Details Name: KALIANA ALBINO MRN: [id number] DOB: 01/29/1941   Cancelled treatment:       Spoke with nursing re: pt tolerance; pt breakfast still present in room at noon, untouched. Pt reports not hungry. Dw nsg that pt may need assistance/encouragement due to mentation. Has been tolerating meds in puree with encouragement.  Deneise Lever, [place], CCC-SLP Speech-Language Pathologist                                                                                                 Aliene Altes 07/19/2020, 12:16 PM

## 2020-07-19 NOTE — Progress Notes (Signed)
   07/17/20 0428  Assess: MEWS Score  Temp 99.5 F (37.5 C)  BP 139/82  Pulse Rate 74  ECG Heart Rate (!) 113  Resp (!) 28  SpO2 95 %  O2 Device Nasal Cannula  O2 Flow Rate (L/min) 6 L/min  Assess: MEWS Score  MEWS Temp 0  MEWS Systolic 0  MEWS Pulse 2  MEWS RR 2  MEWS LOC 0  MEWS Score 4  MEWS Score Color Red  Assess: if the MEWS score is Yellow or Red  Were vital signs taken at a resting state? Yes  Focused Assessment No change from prior assessment  Early Detection of Sepsis Score *See Row Information* Medium  MEWS guidelines implemented *See Row Information* Yes  Treat  MEWS Interventions Consulted Respiratory Therapy (rapid response, on call doc informed)  Pain Scale 0-10  Pain Score 0  Breathing 0  Negative Vocalization 0  Facial Expression 0  Body Language 0  Consolability 0  PAINAD Score 0  Complains of  (none)  Take Vital Signs  Increase Vital Sign Frequency  Red: Q 1hr X 4 then Q 4hr X 4, if remains red, continue Q 4hrs  Escalate  MEWS: Escalate Red: discuss with charge nurse/RN and provider, consider discussing with RRT  Notify: Charge Nurse/RN  Name of Charge Nurse/RN Notified Stanton Kidney  Date Charge Nurse/RN Notified 07/17/20  Time Charge Nurse/RN Notified 0400  Notify: Provider  Provider Name/Title [facility]  Date Provider Notified 07/17/20  Time Provider Notified 0400  Notification Type Page  Notification Reason Change in status  Provider response See new orders  Notify: Rapid Response  Time Rapid Response Notified 0400  Document  Patient Outcome Stabilized after interventions  Progress note created (see row info) Yes  Inserted for [place] Ouch RN

## 2020-07-19 NOTE — Progress Notes (Signed)
PROGRESS NOTE    Lindsay Chung  QIH:[id number] DOB: 04-04-1940 DOA: 07/16/2020 PCP: Tracie Harrier, MD   Assessment & Plan:   Principal Problem:   Sepsis [facility]) Active Problems:   Anxiety   Dementia without behavioral disturbance ([place])   Lewy body dementia with behavioral disturbance ([place])   Weakness   Acute respiratory failure with hypoxia ([place])   Bilateral pneumonia   Severe sepsis: secondary to pneumonia. Continue on cefdinir and azithromycin. Blood cxs grew NGTD. Resolved   Pneumonia: continue on cefdinir, azithromycin. Continue on bronchodilators and encourage incentive spirometry   Acute hypoxic respiratory failure: resolved   Acute metabolic encephalopathy: resolved   Lewy body dementia: continue on home dose of aricept, namenda. Palliative care consulted   Hyponatremia: stable from day prior. Almost WNL   Hypokalemia: WNL today  Hypomagnesemia: resolved    DVT prophylaxis: lovenox  Code Status: DNR Family Communication: discussed pt's care w/ pt's husband, Barnabas Lister, and answered his questions. Palliative care consulted  Disposition Plan: likely d/c to SNF   Level of care: Med-Surg   Status is: Inpatient  Remains inpatient appropriate because:Unsafe d/c plan   Dispo: The patient is from: Home              Anticipated d/c is to: SNF              Patient currently is medically stable to d/c.   Difficult to place patient No    Consultants:   Palliative care    Procedures:    Antimicrobials: azithromycin, cefdinir   Subjective: Pt c/o fatigue   Objective: Vitals:   07/18/20 0019 07/18/20 1540 07/18/20 1929 07/19/20 0450  BP: 123/68 133/69 125/71 (!) 155/87  Pulse: 80 90 84 (!) 105  Resp: 17 18 18 16   Temp: 98 F (36.7 C) 98.3 F (36.8 C) 98 F (36.7 C) 98.3 F (36.8 C)  TempSrc: Oral     SpO2: 95% 91% 94% 91%  Weight:      Height:        Intake/Output Summary (Last 24 hours) at 07/19/2020 0728 Last data filed at 07/19/2020  0530 Gross per 24 hour  Intake 220 ml  Output 800 ml  Net -580 ml   Filed Weights   07/16/20 1211  Weight: 68 kg    Examination:  General exam: Appears calm and comfortable  Respiratory system: Clear to auscultation. Respiratory effort normal. Cardiovascular system: S1 & S2 +. No rubs, gallops or clicks. Gastrointestinal system: Abdomen is nondistended, soft and nontender. Normal bowel sounds heard. Central nervous system: Alert and awake. Moves all extremities Psychiatry: Judgement and insight appear abnormal. Flat mood and affect    Data Reviewed: I have personally reviewed following labs and imaging studies  CBC: Recent Labs  Lab 07/16/20 1212 07/18/20 0411  WBC 8.1 8.5  NEUTROABS 6.5 7.0  HGB 12.3 11.9*  HCT 36.6 34.7*  MCV 86.9 85.5  PLT 180 875   Basic Metabolic Panel: Recent Labs  Lab 07/16/20 1212 07/17/20 0307 07/17/20 0833 07/18/20 0411 07/19/20 0410  NA 132*  --  130* 134* 134*  K 3.7  --  3.2* 3.5 3.8  CL 97*  --  98 106 105  CO2 24  --  18* 21* 20*  GLUCOSE 110*  --  147* 109* 115*  BUN 24*  --  23 33* 35*  CREATININE 1.10*  --  1.26* 1.10* 0.97  CALCIUM 8.1*  --  7.9* 8.2* 8.1*  MG  --  1.3*  --   --  2.5*  PHOS  --  3.5  --   --   --    GFR: Estimated Creatinine Clearance: 43.5 mL/min (by C-G formula based on SCr of 0.97 mg/dL). Liver Function Tests: Recent Labs  Lab 07/16/20 1212  AST 179*  ALT 65*  ALKPHOS 79  BILITOT 0.8  PROT 6.3*  ALBUMIN 3.0*   No results for input(s): LIPASE, AMYLASE in the last 168 hours. No results for input(s): AMMONIA in the last 168 hours. Coagulation Profile: Recent Labs  Lab 07/16/20 1212  INR 1.2   Cardiac Enzymes: No results for input(s): CKTOTAL, CKMB, CKMBINDEX, TROPONINI in the last 168 hours. BNP (last 3 results) No results for input(s): PROBNP in the last 8760 hours. HbA1C: No results for input(s): HGBA1C in the last 72 hours. CBG: No results for input(s): GLUCAP in the last 168  hours. Lipid Profile: No results for input(s): CHOL, HDL, LDLCALC, TRIG, CHOLHDL, LDLDIRECT in the last 72 hours. Thyroid Function Tests: No results for input(s): TSH, T4TOTAL, FREET4, T3FREE, THYROIDAB in the last 72 hours. Anemia Panel: Recent Labs    07/17/20 0307  VITAMINB12 597   Sepsis Labs: Recent Labs  Lab 07/16/20 1212 07/17/20 0307 07/18/20 0411  PROCALCITON 0.11 0.26 2.55  LATICACIDVEN 0.9  --   --     Recent Results (from the past 240 hour(s))  Resp Panel by RT-PCR (Flu A&B, Covid) Nasopharyngeal Swab     Status: None   Collection Time: 07/11/20  3:53 PM   Specimen: Nasopharyngeal Swab; Nasopharyngeal(NP) swabs in vial transport medium  Result Value Ref Range Status   SARS Coronavirus 2 by RT PCR NEGATIVE NEGATIVE Final    Comment: (NOTE) SARS-CoV-2 target nucleic acids are NOT DETECTED.  The SARS-CoV-2 RNA is generally detectable in upper respiratory specimens during the acute phase of infection. The lowest concentration of SARS-CoV-2 viral copies this assay can detect is 138 copies/mL. A negative result does not preclude SARS-Cov-2 infection and should not be used as the sole basis for treatment or other patient management decisions. A negative result may occur with  improper specimen collection/handling, submission of specimen other than nasopharyngeal swab, presence of viral mutation(s) within the areas targeted by this assay, and inadequate number of viral copies(<138 copies/mL). A negative result must be combined with clinical observations, patient history, and epidemiological information. The expected result is Negative.  Fact Sheet for Patients:  EntrepreneurPulse.com.au  Fact Sheet for Healthcare Providers:  IncredibleEmployment.be  This test is no t yet approved or cleared by the [place] FDA and  has been authorized for detection and/or diagnosis of SARS-CoV-2 by FDA under an Emergency Use Authorization  (EUA). This EUA will remain  in effect (meaning this test can be used) for the duration of the COVID-19 declaration under Section 564(b)(1) of the Act, 21 U.S.C.section 360bbb-3(b)(1), unless the authorization is terminated  or revoked sooner.       Influenza A by PCR NEGATIVE NEGATIVE Final   Influenza B by PCR NEGATIVE NEGATIVE Final    Comment: (NOTE) The Xpert Xpress SARS-CoV-2/FLU/RSV plus assay is intended as an aid in the diagnosis of influenza from Nasopharyngeal swab specimens and should not be used as a sole basis for treatment. Nasal washings and aspirates are unacceptable for Xpert Xpress SARS-CoV-2/FLU/RSV testing.  Fact Sheet for Patients: EntrepreneurPulse.com.au  Fact Sheet for Healthcare Providers: IncredibleEmployment.be  This test is not yet approved or cleared by the [place] FDA and has been authorized for detection and/or diagnosis of SARS-CoV-2 by  FDA under an Emergency Use Authorization (EUA). This EUA will remain in effect (meaning this test can be used) for the duration of the COVID-19 declaration under Section 564(b)(1) of the Act, 21 U.S.C. section 360bbb-3(b)(1), unless the authorization is terminated or revoked.  Performed at [facility], [address] [place], [place] [address]   Resp Panel by RT-PCR (Flu A&B, Covid) Nasopharyngeal Swab     Status: None   Collection Time: 07/16/20 12:12 PM   Specimen: Nasopharyngeal Swab; Nasopharyngeal(NP) swabs in vial transport medium  Result Value Ref Range Status   SARS Coronavirus 2 by RT PCR NEGATIVE NEGATIVE Final    Comment: (NOTE) SARS-CoV-2 target nucleic acids are NOT DETECTED.  The SARS-CoV-2 RNA is generally detectable in upper respiratory specimens during the acute phase of infection. The lowest concentration of SARS-CoV-2 viral copies this assay can detect is 138 copies/mL. A negative result does not preclude SARS-Cov-2 infection and  should not be used as the sole basis for treatment or other patient management decisions. A negative result may occur with  improper specimen collection/handling, submission of specimen other than nasopharyngeal swab, presence of viral mutation(s) within the areas targeted by this assay, and inadequate number of viral copies(<138 copies/mL). A negative result must be combined with clinical observations, patient history, and epidemiological information. The expected result is Negative.  Fact Sheet for Patients:  EntrepreneurPulse.com.au  Fact Sheet for Healthcare Providers:  IncredibleEmployment.be  This test is no t yet approved or cleared by the [place] FDA and  has been authorized for detection and/or diagnosis of SARS-CoV-2 by FDA under an Emergency Use Authorization (EUA). This EUA will remain  in effect (meaning this test can be used) for the duration of the COVID-19 declaration under Section 564(b)(1) of the Act, 21 U.S.C.section 360bbb-3(b)(1), unless the authorization is terminated  or revoked sooner.       Influenza A by PCR NEGATIVE NEGATIVE Final   Influenza B by PCR NEGATIVE NEGATIVE Final    Comment: (NOTE) The Xpert Xpress SARS-CoV-2/FLU/RSV plus assay is intended as an aid in the diagnosis of influenza from Nasopharyngeal swab specimens and should not be used as a sole basis for treatment. Nasal washings and aspirates are unacceptable for Xpert Xpress SARS-CoV-2/FLU/RSV testing.  Fact Sheet for Patients: EntrepreneurPulse.com.au  Fact Sheet for Healthcare Providers: IncredibleEmployment.be  This test is not yet approved or cleared by the [place] FDA and has been authorized for detection and/or diagnosis of SARS-CoV-2 by FDA under an Emergency Use Authorization (EUA). This EUA will remain in effect (meaning this test can be used) for the duration of the COVID-19 declaration  under Section 564(b)(1) of the Act, 21 U.S.C. section 360bbb-3(b)(1), unless the authorization is terminated or revoked.  Performed at [facility], [address] [place], [place] [address]   Blood Culture (routine x 2)     Status: None (Preliminary result)   Collection Time: 07/16/20 12:12 PM   Specimen: BLOOD  Result Value Ref Range Status   Specimen Description   Final    BLOOD Blood Culture results may not be optimal due to an inadequate volume of blood received in culture bottles   Special Requests   Final    BOTTLES DRAWN AEROBIC AND ANAEROBIC RIGHT ANTECUBITAL   Culture   Final    NO GROWTH 3 DAYS Performed at [facility], [address] [place], [place] [address]    Report Status PENDING  Incomplete  Blood Culture (routine x 2)  Status: None (Preliminary result)   Collection Time: 07/16/20 12:12 PM   Specimen: BLOOD  Result Value Ref Range Status   Specimen Description   Final    BLOOD Blood Culture results may not be optimal due to an inadequate volume of blood received in culture bottles   Special Requests   Final    BOTTLES DRAWN AEROBIC AND ANAEROBIC BLOOD LEFT HAND   Culture   Final    NO GROWTH 3 DAYS Performed at [facility], [address], [place], [place] [address]    Report Status PENDING  Incomplete  Urine culture     Status: None   Collection Time: 07/16/20 12:12 PM   Specimen: Urine, Random  Result Value Ref Range Status   Specimen Description   Final    URINE, RANDOM Performed at [facility], [address], [place], [place] [address]    Special Requests   Final    NONE Performed at [facility], [address], [place], [place] [address]    Culture   Final    NO GROWTH Performed at [facility], [place] [address] [place], [place] [address]    Report Status 07/17/2020 FINAL  Final         Radiology Studies: CT CHEST WO CONTRAST  Result Date: 07/17/2020 CLINICAL DATA:   Respiratory failure, lethargy, increased work of breathing. EXAM: CT CHEST WITHOUT CONTRAST TECHNIQUE: Multidetector CT imaging of the chest was performed following the standard protocol without IV contrast. COMPARISON:  07/02/2020. FINDINGS: Cardiovascular: Atherosclerotic calcification of the aorta, aortic valve and coronary arteries. Pulmonic trunk and heart are enlarged. No pericardial effusion. Mediastinum/Nodes: No pathologically enlarged mediastinal or axillary lymph nodes. Hilar regions are difficult to definitively evaluate without IV contrast. Esophagus is grossly unremarkable. Lungs/Pleura: Image quality is rather degraded by expiratory phase imaging and respiratory motion. New patchy areas of ground-glass bilaterally with dependent atelectasis. Associated smooth septal thickening. No pleural fluid. Debris is seen in the airway. Upper Abdomen: Low-attenuation lesions in the liver measure up to 1.6 cm in the dome, as before. Further characterization is limited without post-contrast imaging or due to size. Visualized portions of the liver and gallbladder are otherwise unremarkable. Nodular thickening of the right adrenal gland. Adreniform thickening of the left adrenal gland. Stones in the kidneys bilaterally. There may be a subcentimeter exophytic lesion off the interpolar left kidney, incompletely visualized. Visualized portions of the kidneys, spleen, pancreas, stomach and bowel are otherwise unremarkable. No upper abdominal adenopathy. Musculoskeletal: Degenerative changes in the spine and shoulders. IMPRESSION: 1. Image quality is degraded by expiratory phase imaging and respiratory motion. Mild pulmonary edema. Difficult to definitively exclude pneumonia. 2. Bilateral renal stones. 3. Aortic atherosclerosis (ICD10-I70.0). Coronary artery calcification. 4. Enlarged pulmonic trunk, indicative of pulmonary arterial hypertension. Electronically Signed   By: Lorin Picket M.D.   On: 07/17/2020 13:36         Scheduled Meds: . aspirin  81 mg Oral Daily  . azithromycin  500 mg Oral Daily  . busPIRone  15 mg Oral TID  . cefdinir  300 mg Oral Q12H  . dextromethorphan-guaiFENesin  1 tablet Oral BID  . donepezil  5 mg Oral QHS  . enoxaparin (LOVENOX) injection  40 mg Subcutaneous Q24H  . FLUoxetine  20 mg Oral BID  . loratadine  10 mg Oral Daily  . memantine  5 mg Oral BID  . metoprolol tartrate  25 mg Oral BID  . multivitamin with minerals  1  tablet Oral Daily  . pravastatin  20 mg Oral q1800  . risperiDONE  0.5 mg Oral BID  . vitamin B-12  1,000 mcg Oral Daily   Continuous Infusions:   LOS: 2 days    Time spent: 30 mins     Wyvonnia Dusky, MD [facility] Pager [phone]  If 7PM-7AM, please contact night-coverage 07/19/2020, 7:28 AM

## 2020-07-19 NOTE — Progress Notes (Signed)
Occupational Therapy Treatment Patient Details Name: Lindsay Chung MRN: [id number] DOB: 1940/08/08 Today's Date: 07/19/2020    History of present illness 80 y.o. female with medical history significant for Lewy body dementia, hyperlipidemia, depression, anxiety, GERD, and hypertension, who presents to the emergency department for chief concerns of lethargy.   OT comments  Lindsay Chung presents today with generalized weakness and reduced endurance, limiting her ability to engage in functional mobility tasks. She reports, "I do not feel good." When asked to elaborate on that, Lindsay Chung states that she is not having any pain, but that she is very tired and that she is "depressed." She has a flat affect, difficulty making eye contact, and shows limited engagement with people in the room. Her lunch has arrived but she says she has no interest in eating. Pt has a BM in bed, requires MaxA +2 for rolling, cleaning, changing linens. Lindsay Chung is able, with hand-over-hand assist, to reach cross body and grab opposite side bedrails, but has very limited ability to pull on railing or assist with sideways roll. She is able, however, to engage with SUPV in grooming tasks seated in bed, an improvement from a few days earlier, when she required Mod A + cueing for bed-level grooming. Lindsay Chung can state that she is "in a hospital," but does not recall its name. She does not know the year, identifies the month as October, and, asked with whom she lives, responds "my mama and my daddy."  After encouragement to eat, pt agreed to drinking some tea, which she was able to do [facility], using a straw. Had the therapist not encouraged it, however, it is unlikely that pt would have initiated eating/drinking INDly -- she appears to require encouragement/instruction prior to initiating any task. Pt is far from her baseline performance (e.g., 3 weeks ago, ambulating with MOD I with RW and performing seated ADL INDly). Recommend ongoing  OT while hospitalized, with DC to SNF if pt/family is willing. If not, recommend hoyer lift for home use.     Follow Up Recommendations  SNF    Equipment Recommendations       Recommendations for Other Services      Precautions / Restrictions Precautions Precautions: Fall Restrictions Weight Bearing Restrictions: No       Mobility Bed Mobility Overal bed mobility: Needs Assistance Bed Mobility: Rolling Rolling: Max assist;+2 for physical assistance   Supine to sit: Max assist Sit to supine: Max assist;+2 for physical assistance;+2 for safety/equipment        Transfers Overall transfer level: Needs assistance Equipment used: Rolling walker (2 wheeled) Transfers: Sit to/from Stand Sit to Stand: Max assist;+2 physical assistance         General transfer comment: Pt unsafe for OOB t/f, 2/2 lethargy    Balance Overall balance assessment: Needs assistance Sitting-balance support: Feet supported;Bilateral upper extremity supported Sitting balance-Leahy Scale: Poor Sitting balance - Comments: CGA-minA to maintain   Standing balance support: Bilateral upper extremity supported;During functional activity Standing balance-Leahy Scale: Poor Standing balance comment: not attempted                           ADL either performed or assessed with clinical judgement   ADL Overall ADL's : Needs assistance/impaired Eating/Feeding: Set up;Supervision/ safety;Cueing for sequencing   Grooming: Wash/dry face;Brushing hair;Bed level;Set up;Supervision/safety;Cueing for sequencing Grooming Details (indicate cue type and reason): Today pt able to perform bed-level grooming with SUPV + 1 cue for  sequencing                   Toilet Transfer Details (indicate cue type and reason): Had BM in bed Toileting- Clothing Manipulation and Hygiene: +2 for physical assistance;Maximal assistance         General ADL Comments: SUPV/Set-up for grooming at bed level, Max A +2  for toileting hygenie     Vision Patient Visual Report: No change from baseline Additional Comments: pt makes limited eye contact   Perception     Praxis      Cognition Arousal/Alertness: Awake/alert Behavior During Therapy: WFL for tasks assessed/performed Overall Cognitive Status: History of cognitive impairments - at baseline                                 General Comments: Pt lethargic, states she is "depressed," doesn't feel good        Exercises Total Joint Exercises Ankle Circles/Pumps: AROM;Both;Strengthening;10 reps Heel Slides: AROM;AAROM;Right;Both;10 reps Hip ABduction/ADduction: AAROM;Strengthening;Both;10 reps Long Arc Quad: AROM;Strengthening;Both;10 reps Other Exercises Other Exercises: supine ther-ex on B UE   Shoulder Instructions       General Comments      Pertinent Vitals/ Pain       Pain Assessment: No/denies pain  Home Living                                          Prior Functioning/Environment              Frequency  Min 1X/week        Progress Toward Goals  OT Goals(current goals can now be found in the care plan section)  Progress towards OT goals: Progressing toward goals  Acute Rehab OT Goals Patient Stated Goal: to get stronger OT Goal Formulation: With family Time For Goal Achievement: 07/31/20 Potential to Achieve Goals: [place] Discharge plan remains appropriate    Co-evaluation                 AM-PAC OT "6 Clicks" Daily Activity     Outcome Measure   Help from another person eating meals?: A Little Help from another person taking care of personal grooming?: A Little Help from another person toileting, which includes using toliet, bedpan, or urinal?: Total Help from another person bathing (including washing, rinsing, drying)?: A Lot Help from another person to put on and taking off regular upper body clothing?: A Little Help from another person to put on and taking  off regular lower body clothing?: A Lot 6 Click Score: 14    End of Session    OT Visit Diagnosis: Unsteadiness on feet (R26.81);Muscle weakness (generalized) (M62.81);Other symptoms and signs involving cognitive function   Activity Tolerance Patient limited by lethargy   Patient Left in bed;with call bell/phone within reach;with bed alarm set;with nursing/sitter in room   Nurse Communication          Time: 1517-6160 OT Time Calculation (min): 10 min  Charges: OT General Charges $OT Visit: 1 Visit OT Treatments $Self Care/Home Management : 8-22 mins  Josiah Lobo, PhD, MS, OTR/L 07/19/20, 2:55 PM

## 2020-07-19 NOTE — Consult Note (Signed)
Consultation Note Date: 07/19/2020   Patient Name: Lindsay Chung  DOB: 05/16/1940  MRN: [id number]  Age / Sex: 80 y.o., female  PCP: Tracie Harrier, MD Referring Physician: Wyvonnia Dusky, MD  Reason for Consultation: Establishing goals of care  HPI/Patient Profile: Lindsay Chung is a 80 y.o. female with medical history significant for Lewy body dementia, hyperlipidemia, depression, psychiatric imbalance, anxiety, GERD, hypertension, presents to the emergency department for chief concerns of lethargy.  Clinical Assessment and Goals of Care: Patient is resting in bed, she states "I don't feel good" but cannot say why she does not feel good, or what doesn't feel good. She states she is married with 2 children. She states she is retired from [employer]. She states she spends a lot of time in the bed and chair, but does the cooking and cleaning. Inquired about her health history given her facial asymmetry, and she states she has ssen doctors, but she does not know if it has been evaluated or what was said. She states it has been present for a few weeks. She states her name, that she is at the hospital, and the year of 2022. She states the president is [place], and then states she does not know.   Called to speak with her husband. He states they have been married around 25 years. He states she has a daughter from a previous marriage and he has a son from a previous marriage. He states she has had a marked decline over the past 6 weeks. He states she was able to use a walker 6 weeks ago, and has became so weak it takes 2 people about all the strength they can muster to move her from the couch to the bedside toilet. He states they have to help with virtually all things. He states her appetite has been poor. Husband states at times her memory is such that she cannot remember basic things, and he does not understand  how rehab could teach her new things due to her dementia. He states he has considered hospice as he used to volunteer for them years ago.       Plans for a family meeting tomorrow at 12:00.     SUMMARY OF RECOMMENDATIONS    Family meeting tomorrow at 12:00.   Prognosis:   Very poor      Primary Diagnoses: Present on Admission: . Anxiety . Dementia without behavioral disturbance ([place]) . Lewy body dementia with behavioral disturbance ([place]) . Acute respiratory failure with hypoxia ([place]) . Sepsis ([place]) . Bilateral pneumonia   I have reviewed the medical record, interviewed the patient and family, and examined the patient. The following aspects are pertinent.  Past Medical History:  Diagnosis Date  . Anxiety   . Fibromyalgia   . GERD (gastroesophageal reflux disease)   . Hyperlipidemia   . Hypertension   . Irritable bowel syndrome   . Lewy body dementia ([place])   . Memory difficulty    Social History   Socioeconomic History  .  Marital status: Married    Spouse name: Not on file  . Number of children: Not on file  . Years of education: Not on file  . Highest education level: Not on file  Occupational History  . Not on file  Tobacco Use  . Smoking status: Former Research scientist (life sciences)  . Smokeless tobacco: Never Used  . Tobacco comment: "quit years ago"  Vaping Use  . Vaping Use: Never used  Substance and Sexual Activity  . Alcohol use: Not Currently  . Drug use: Not Currently  . Sexual activity: Not Currently  Other Topics Concern  . Not on file  Social History Narrative  . Not on file   Social Determinants of Health   Financial Resource Strain: Not on file  Food Insecurity: Not on file  Transportation Needs: Not on file  Physical Activity: Not on file  Stress: Not on file  Social Connections: Not on file   Family History  Problem Relation Age of Onset  . Breast cancer Neg Hx    Scheduled Meds: . aspirin  81 mg Oral Daily  . azithromycin  500 mg Oral Daily  .  busPIRone  15 mg Oral TID  . cefdinir  300 mg Oral Q12H  . dextromethorphan-guaiFENesin  1 tablet Oral BID  . donepezil  5 mg Oral QHS  . enoxaparin (LOVENOX) injection  40 mg Subcutaneous Q24H  . FLUoxetine  20 mg Oral BID  . loratadine  10 mg Oral Daily  . memantine  5 mg Oral BID  . metoprolol tartrate  25 mg Oral BID  . multivitamin with minerals  1 tablet Oral Daily  . pravastatin  20 mg Oral q1800  . risperiDONE  0.5 mg Oral BID  . vitamin B-12  1,000 mcg Oral Daily   Continuous Infusions: PRN Meds:.acetaminophen **OR** acetaminophen, ALPRAZolam, ipratropium-albuterol, labetalol, ondansetron **OR** ondansetron (ZOFRAN) IV Medications Prior to Admission:  Prior to Admission medications   Medication Sig Start Date End Date Taking? Authorizing Provider  acetaminophen (TYLENOL) 500 MG tablet Take 1,000 mg by mouth every 6 (six) hours as needed for mild pain.   Yes [provider]  ALPRAZolam Duanne Moron) 1 MG tablet Take 1 mg by mouth 3 (three) times daily as needed. 06/26/20  Yes [provider]  aspirin 81 MG tablet Take 81 mg by mouth daily.   Yes [provider]  busPIRone (BUSPAR) 15 MG tablet Take 15 mg by mouth 3 (three) times daily.   Yes [provider]  calcium-vitamin D (OSCAL WITH D) 500-200 MG-UNIT tablet Take 1 tablet by mouth 2 (two) times daily. 03/28/19  Yes Thornell Mule, MD  cetirizine (ZYRTEC) 10 MG tablet Take 10 mg by mouth daily.   Yes [provider]  Cholecalciferol (VITAMIN D) 50 MCG (2000 UT) tablet Take 2,000 Units by mouth daily.    Yes [provider]  clobetasol cream (TEMOVATE) 2.84 % Apply 1 application topically daily as needed.   Yes [provider]  FLUoxetine (PROZAC) 20 MG capsule Take 1 capsule (20 mg total) by mouth 2 (two) times daily. Patient taking differently: Take 20-80 mg by mouth daily. 20mg  in am and at noon and 80mg  at night 03/28/19  Yes Thornell Mule, MD  fluvastatin (LESCOL)  40 MG capsule Take 40 mg by mouth daily.   Yes [provider]  memantine (NAMENDA) 5 MG tablet Take 5 mg by mouth daily.   Yes [provider]  metoprolol tartrate (LOPRESSOR) 25 MG tablet Take 25  mg by mouth 2 (two) times daily.    Yes [provider]  Multiple Vitamin (MULTIVITAMIN) tablet Take 1 tablet by mouth daily.   Yes [provider]  risperiDONE (RISPERDAL) 0.25 MG tablet Take 0.5 mg by mouth in the morning, at noon, and at bedtime. 1 tablet in am and noon and 2 tablets   Yes [provider]  tetrahydrozoline 0.05 % ophthalmic solution Place 1 drop into both eyes daily.   Yes [provider]  vitamin B-12 (CYANOCOBALAMIN) 1000 MCG tablet Take 1,000 mcg by mouth daily.   Yes [provider]  ALPRAZolam Duanne Moron) 0.5 MG tablet Take 0.5 mg by mouth 3 (three) times daily.  Patient not taking: Reported on 07/16/2020 08/10/14   [provider]  busPIRone (BUSPAR) 10 MG tablet Take 10 mg by mouth in the morning, at noon, and at bedtime. Patient not taking: Reported on 07/16/2020 07/08/20   [provider]  donepezil (ARICEPT) 5 MG tablet Take 5 mg by mouth daily.  Patient not taking: No sig reported 03/17/19   [provider]  omeprazole (PRILOSEC) 20 MG capsule Take 20 mg by mouth daily.  Patient not taking: No sig reported 06/12/14   [provider]   Allergies  Allergen Reactions  . Amoxicillin Other (See Comments)    Patient states that she can take this medication.    Review of Systems  Constitutional:       Says she does not feel good.     Physical Exam Pulmonary:     Effort: Pulmonary effort is normal.  Neurological:     Mental Status: She is alert.     Vital Signs: BP 138/76 (BP Location: Right Arm)   Pulse 91   Temp 98.4 F (36.9 C) (Oral)   Resp 17   Ht 5\' 3"  (1.6 m)   Wt 68 kg   SpO2 91%   BMI 26.56 kg/m  Pain Scale: 0-10   Pain Score: 0-No pain   SpO2: SpO2: 91  % O2 Device:SpO2: 91 % O2 Flow Rate: .O2 Flow Rate (L/min): 6 L/min  IO: Intake/output summary:   Intake/Output Summary (Last 24 hours) at 07/19/2020 1520 Last data filed at 07/19/2020 0530 Gross per 24 hour  Intake 100 ml  Output 450 ml  Net -350 ml    LBM:   Baseline Weight: Weight: 68 kg Most recent weight: Weight: 68 kg     Palliative Assessment/Data: 30%     Time In: 3:20 Time Out: 4:00 Time Total: 40 min Greater than 50%  of this time was spent counseling and coordinating care related to the above assessment and plan.  Signed by: Asencion Gowda, NP   Please contact Palliative Medicine Team phone at [phone] for questions and concerns.  For individual provider: See Shea Evans

## 2020-07-20 LAB — CBC
HCT: 36.4 % (ref 36.0–46.0)
Hemoglobin: 12.1 g/dL (ref 12.0–15.0)
MCH: 28.5 pg (ref 26.0–34.0)
MCHC: 33.2 g/dL (ref 30.0–36.0)
MCV: 85.6 fL (ref 80.0–100.0)
Platelets: 210 10*3/uL (ref 150–400)
RBC: 4.25 MIL/uL (ref 3.87–5.11)
RDW: 13.3 % (ref 11.5–15.5)
WBC: 9.8 10*3/uL (ref 4.0–10.5)
nRBC: 0 % (ref 0.0–0.2)

## 2020-07-20 LAB — COMPREHENSIVE METABOLIC PANEL
ALT: 84 U/L — ABNORMAL HIGH (ref 0–44)
AST: 180 U/L — ABNORMAL HIGH (ref 15–41)
Albumin: 2.6 g/dL — ABNORMAL LOW (ref 3.5–5.0)
Alkaline Phosphatase: 74 U/L (ref 38–126)
Anion gap: 9 (ref 5–15)
BUN: 35 mg/dL — ABNORMAL HIGH (ref 8–23)
CO2: 21 mmol/L — ABNORMAL LOW (ref 22–32)
Calcium: 8 mg/dL — ABNORMAL LOW (ref 8.9–10.3)
Chloride: 104 mmol/L (ref 98–111)
Creatinine, Ser: 0.88 mg/dL (ref 0.44–1.00)
GFR, Estimated: >60 mL/min (ref >60)
Glucose, Bld: 111 mg/dL — ABNORMAL HIGH (ref 70–99)
Potassium: 3.4 mmol/L — ABNORMAL LOW (ref 3.5–5.1)
Sodium: 134 mmol/L — ABNORMAL LOW (ref 135–145)
Total Bilirubin: 0.9 mg/dL (ref 0.3–1.2)
Total Protein: 5.6 g/dL — ABNORMAL LOW (ref 6.5–8.1)

## 2020-07-20 MED ORDER — BIOTENE DRY MOUTH MT LIQD: 15.0000 mL | OROMUCOSAL | Status: DC | PRN

## 2020-07-20 MED ORDER — LORAZEPAM 2 MG/ML IJ SOLN: 1.0000 mg | INTRAMUSCULAR | Status: DC | PRN

## 2020-07-20 MED ORDER — ACETAMINOPHEN 650 MG RE SUPP: 650.0000 mg | Freq: Four times a day (QID) | RECTAL | Status: DC | PRN

## 2020-07-20 MED ORDER — ACETAMINOPHEN 325 MG PO TABS: 650.0000 mg | ORAL_TABLET | Freq: Four times a day (QID) | ORAL | Status: DC | PRN

## 2020-07-20 MED ORDER — POLYVINYL ALCOHOL 1.4 % OP SOLN
1.0000 [drp] | Freq: Four times a day (QID) | OPHTHALMIC | Status: DC | PRN
  Filled 2020-07-20: qty 15

## 2020-07-20 MED ORDER — HALOPERIDOL LACTATE 5 MG/ML IJ SOLN: 0.5000 mg | INTRAMUSCULAR | Status: DC | PRN

## 2020-07-20 MED ORDER — OXYCODONE HCL 5 MG PO TABS: 5.0000 mg | ORAL_TABLET | ORAL | Status: DC | PRN

## 2020-07-20 MED ORDER — GLYCOPYRROLATE 0.2 MG/ML IJ SOLN
0.2000 mg | INTRAMUSCULAR | Status: DC | PRN
  Filled 2020-07-20: qty 1

## 2020-07-20 MED ORDER — ONDANSETRON HCL 4 MG/2ML IJ SOLN: 4.0000 mg | Freq: Four times a day (QID) | INTRAMUSCULAR | Status: DC | PRN

## 2020-07-20 MED ORDER — LORAZEPAM 1 MG PO TABS
1.0000 mg | ORAL_TABLET | ORAL | Status: DC | PRN
Start: 2020-07-20 — End: 2020-07-22

## 2020-07-20 MED ORDER — HALOPERIDOL LACTATE 2 MG/ML PO CONC
0.5000 mg | ORAL | Status: DC | PRN
  Filled 2020-07-20: qty 0.3

## 2020-07-20 MED ORDER — HALOPERIDOL 0.5 MG PO TABS
0.5000 mg | ORAL_TABLET | ORAL | Status: DC | PRN
  Filled 2020-07-20: qty 1

## 2020-07-20 MED ORDER — MORPHINE SULFATE (PF) 2 MG/ML IV SOLN
1.0000 mg | INTRAVENOUS | Status: DC | PRN
Start: 2020-07-20 — End: 2020-07-22

## 2020-07-20 MED ORDER — ONDANSETRON 4 MG PO TBDP
4.0000 mg | ORAL_TABLET | Freq: Four times a day (QID) | ORAL | Status: DC | PRN
  Filled 2020-07-20: qty 1

## 2020-07-20 MED ORDER — GLYCOPYRROLATE 1 MG PO TABS
1.0000 mg | ORAL_TABLET | ORAL | Status: DC | PRN
  Filled 2020-07-20: qty 1

## 2020-07-20 MED ORDER — POTASSIUM CHLORIDE CRYS ER 20 MEQ PO TBCR
20.0000 meq | EXTENDED_RELEASE_TABLET | Freq: Once | ORAL | Status: AC
  Administered 2020-07-20: 20 meq via ORAL
  Filled 2020-07-20: qty 1

## 2020-07-20 NOTE — Progress Notes (Signed)
Daily Progress Note   Patient Name: Lindsay Chung       Date: 07/20/2020 DOB: [birth year]  Age: 80 y.o. MRN#: [id number] Attending Physician: Wyvonnia Dusky, MD Primary Care Physician: Tracie Harrier, MD Admit Date: 07/16/2020  Reason for Consultation/Follow-up: Establishing goals of care  Subjective: Patient is resting in bed with husband and daughter at beside. Per staff she has not been eating, and has been taking sips.   We discussed her diagnosis, prognosis, GOC, EOL wishes disposition and options.  Created space and opportunity for patient  to explore thoughts and feelings regarding current medical information. Daughter discusses her mother's decline and that she has weakened to the point of requiring maximum assistance with moving. She states her mother does not have a QOL. She states she does not want her mother to suffer.   A detailed discussion was had today regarding advanced directives.  Concepts specific to code status, artifical feeding and hydration, IV antibiotics and rehospitalization were discussed.  The difference between an aggressive medical intervention path and a comfort care path was discussed.  Values and goals of care important to patient and family were attempted to be elicited.  Discussed limitations of medical interventions to prolong quality of life in some situations and discussed the concept of human mortality. Patient and family are of faith and voice knowing she has a Games developer home and parents and other family waiting for her there.   Patient states she just wants to be comfortable and spend time with her family for what time she has left until she dies. She does not want any further life prolonging treatment and wants comfort care. Husband and daughter  agrees with this. Referral for hospice facility.   Lunch tray came and husband ate the meal as she stated she was not hungry.   I completed a MOST form today that was signed by husband per patient's request,  and the signed original was placed in the chart. A photocopy was placed in the chart to be scanned into EMR. The patient outlined their wishes for the following treatment decisions:  Cardiopulmonary Resuscitation: Do Not Attempt Resuscitation (DNR/No CPR)  Medical Interventions: Comfort Measures: Keep clean, warm, and dry. Use medication by any route, positioning, wound care, and other measures to relieve pain and suffering. Use oxygen, suction and manual  treatment of airway obstruction as needed for comfort. Do not transfer to the hospital unless comfort needs cannot be met in current location.  Antibiotics: No antibiotics (use other measures to relieve symptoms)  IV Fluids: No IV fluids (provide other measures to ensure comfort)  Feeding Tube: No feeding tube      Length of Stay: 3  Current Medications: Scheduled Meds:  . aspirin  81 mg Oral Daily  . busPIRone  15 mg Oral TID  . cefdinir  300 mg Oral Q12H  . dextromethorphan-guaiFENesin  1 tablet Oral BID  . donepezil  5 mg Oral QHS  . enoxaparin (LOVENOX) injection  40 mg Subcutaneous Q24H  . FLUoxetine  20 mg Oral BID  . loratadine  10 mg Oral Daily  . memantine  5 mg Oral BID  . metoprolol tartrate  25 mg Oral BID  . multivitamin with minerals  1 tablet Oral Daily  . pravastatin  20 mg Oral q1800  . risperiDONE  0.5 mg Oral BID  . vitamin B-12  1,000 mcg Oral Daily    Continuous Infusions:   PRN Meds: acetaminophen **OR** acetaminophen, ALPRAZolam, ipratropium-albuterol, labetalol, ondansetron **OR** ondansetron (ZOFRAN) IV  Physical Exam Pulmonary:     Effort: Pulmonary effort is normal.  Skin:    General: Skin is warm and dry.  Neurological:     Mental Status: She is alert.             Vital Signs: BP (!)  147/79 (BP Location: Left Arm)   Pulse (!) 54   Temp 98.3 F (36.8 C)   Resp 18   Ht 5\' 3"  (1.6 m)   Wt 68 kg   SpO2 90%   BMI 26.56 kg/m  SpO2: SpO2: 90 % O2 Device: O2 Device: Room Air O2 Flow Rate: O2 Flow Rate (L/min): 6 L/min  Intake/output summary:   Intake/Output Summary (Last 24 hours) at 07/20/2020 1320 Last data filed at 07/20/2020 1031 Gross per 24 hour  Intake 0 ml  Output --  Net 0 ml   LBM: Last BM Date: 07/19/20 Baseline Weight: Weight: 68 kg Most recent weight: Weight: 68 kg         Patient Active Problem List   Diagnosis Date Noted  . Bilateral pneumonia 07/18/2020  . Acute respiratory failure with hypoxia (HCC) 07/17/2020  . Sepsis ([facility]) 07/16/2020  . Weakness 07/16/2020  . Lewy body dementia with behavioral disturbance ([facility]CC) 07/03/2020  . Hypokalemia 03/27/2019  . Anxiety 03/27/2019  . Dementia without behavioral disturbance ([facility]CC) 03/27/2019  . Hypocalcemia 03/26/2019  . Hypomagnesemia   . Nausea without vomiting   . Poor appetite   . Diarrhea     Palliative Care Assessment & Plan    Recommendations/Plan:  Hospice facility placement.      Code Status:    Code Status Orders  (From admission, onward)         Start     Ordered   07/16/20 1735  Do not attempt resuscitation (DNR)  Continuous       Question Answer Comment  In the event of cardiac or respiratory ARREST Do not call a "code blue"   In the event of cardiac or respiratory ARREST Do not perform Intubation, CPR, defibrillation or ACLS   In the event of cardiac or respiratory ARREST Use medication by any route, position, wound care, and other measures to relive pain and suffering. May use oxygen, suction and manual treatment of airway obstruction as needed for comfort.  07/16/20 1736        Code Status History    Date Active Date Inactive Code Status Order ID Comments User Context   03/26/2019 1208 03/28/2019 1937 Full Code [id number]  Lorella Nimrod, MD ED   Advance  Care Planning Activity    Advance Directive Documentation   Flowsheet Row Most Recent Value  Type of Advance Directive Healthcare Power of Attorney  Pre-existing out of facility DNR order (yellow form or pink MOST form) --  "MOST" Form in Place? --       Prognosis:  < 2 weeks  Very poor PO intake. Admitted for sepsis: PNA.     Care plan was discussed with staff  Thank you for allowing the Palliative Medicine Team to assist in the care of this patient.   Time In: 12:20 Time Out: 1:10 Total Time 50 min Prolonged Time Billed no      Greater than 50%  of this time was spent counseling and coordinating care related to the above assessment and plan.  Asencion Gowda, NP  Please contact Palliative Medicine Team phone at [phone] for questions and concerns.

## 2020-07-20 NOTE — Progress Notes (Signed)
PT Cancellation Note  Patient Details Name: Lindsay Chung MRN: [id number] DOB: 1940/10/23   Cancelled Treatment:     PT orders cancelled, PT to sign off.   Lieutenant Diego PT, DPT 2:00 PM,07/20/20

## 2020-07-20 NOTE — Care Management Important Message (Signed)
Important Message  Patient Details  Name: Lindsay Chung MRN: [id number] Date of Birth: 1940-11-25   Medicare Important Message Given:  Other (see comment)  Per TOC handoff, referral made for hospice facility.  Medicare IM withheld at this time out of respect for patient and family.   Dannette Barbara 07/20/2020, 1:50 PM

## 2020-07-20 NOTE — Progress Notes (Addendum)
[facility]) Hospital Liaison RN note:  Received request from Asencion Gowda, NP for family interest in Lindsay Chung. Chart reviewed and eligibility was approved. Spoke with spouse, Barnabas Lister, at bedside to confirm interest and explain services. He verbalized understanding and questions were answered. Unfortunately, Hospice [facility] is not able to offer a room today. Hospital care team is aware. [facility] Liaison will continue to follow for room availability.   Please call with any hospice related questions or concerns.  Thank you for the opportunity to participate in this patient's care.  Zandra Abts, RN [facility] Liaison  [phone]

## 2020-07-20 NOTE — Progress Notes (Signed)
SLP Cancellation Note  Patient Details Name: Lindsay Chung MRN: [id number] DOB: [birth year]   Cancelled treatment:       Reason Eval/Treat Not Completed: Other (comment) (Per palliative note, referral for hospice placement has been made; pt has been refusing meals, taking sips only with encouragement. No further ST needs at this time; will s/o.)  Deneise Lever, [place], CCC-SLP Speech-Language Pathologist  Aliene Altes 07/20/2020, 1:55 PM

## 2020-07-20 NOTE — Progress Notes (Signed)
PROGRESS NOTE    Lindsay Chung  IHK:[id number] DOB: Jun 25, 1940 DOA: 07/16/2020 PCP: Tracie Harrier, MD   Assessment & Plan:   Principal Problem:   Sepsis [facility]) Active Problems:   Anxiety   Dementia without behavioral disturbance ([place])   Lewy body dementia with behavioral disturbance ([place])   Weakness   Acute respiratory failure with hypoxia ([place])   Bilateral pneumonia   Severe sepsis: secondary to pneumonia. Continue on cefdinir and azithromycin. Blood cxs grew NGTD. Resolved   Pneumonia: continue on cefdinir, azithromycin. Continue on bronchodilators and encourage incentive spirometry   Acute hypoxic respiratory failure: resolved   Acute metabolic encephalopathy: labile. Likely secondary to dementia   Lewy body dementia: continue on home dose of namenda, aricept. Family meeting today w/ palliative care   Hyponatremia: stable. Will continue to monitor   Hypokalemia: KCl repleated. Will continue to monitor   Hypomagnesemia: resolved    DVT prophylaxis: lovenox  Code Status: DNR Family Communication:  Disposition Plan: likely d/c to SNF vs hospice facility, family meeting today   Level of care: Med-Surg   Status is: Inpatient  Remains inpatient appropriate because:Unsafe d/c plan   Dispo: The patient is from: Home              Anticipated d/c is to: SNF vs hospice facility               Patient currently is medically stable to d/c.   Difficult to place patient unclear     Consultants:   Palliative care    Procedures:    Antimicrobials: azithromycin, cefdinir   Subjective: Pt c/o malaise.   Objective: Vitals:   07/19/20 1917 07/19/20 2217 07/20/20 0325 07/20/20 0601  BP: 128/65 (!) 147/82 130/82   Pulse: 86 92 88   Resp: 17  17   Temp: 98.6 F (37 C)  98.5 F (36.9 C)   TempSrc:      SpO2: 94%  90% 94%  Weight:      Height:       No intake or output data in the 24 hours ending 07/20/20 0734 Filed Weights   07/16/20 1211   Weight: 68 kg    Examination:  General exam: Appears uncomfortable  Respiratory system: diminished breath sounds b/l. No rales  Cardiovascular system: S1/S2+. No rubs or gallops  Gastrointestinal system: Abd is soft, NT, ND & hypoactive bowel sounds  Central nervous system: Alert and awake. Moves all extremities  Psychiatry: judgement and insight appear abnormal. Flat mood and affect     Data Reviewed: I have personally reviewed following labs and imaging studies  CBC: Recent Labs  Lab 07/16/20 1212 07/18/20 0411 07/20/20 0525  WBC 8.1 8.5 9.8  NEUTROABS 6.5 7.0  --   HGB 12.3 11.9* 12.1  HCT 36.6 34.7* 36.4  MCV 86.9 85.5 85.6  PLT 180 154 756   Basic Metabolic Panel: Recent Labs  Lab 07/16/20 1212 07/17/20 0307 07/17/20 0833 07/18/20 0411 07/19/20 0410 07/20/20 0525  NA 132*  --  130* 134* 134* 134*  K 3.7  --  3.2* 3.5 3.8 3.4*  CL 97*  --  98 106 105 104  CO2 24  --  18* 21* 20* 21*  GLUCOSE 110*  --  147* 109* 115* 111*  BUN 24*  --  23 33* 35* 35*  CREATININE 1.10*  --  1.26* 1.10* 0.97 0.88  CALCIUM 8.1*  --  7.9* 8.2* 8.1* 8.0*  MG  --  1.3*  --   --  2.5*  --   PHOS  --  3.5  --   --   --   --    GFR: Estimated Creatinine Clearance: 48 mL/min (by C-G formula based on SCr of 0.88 mg/dL). Liver Function Tests: Recent Labs  Lab 07/16/20 1212 07/20/20 0525  AST 179* 180*  ALT 65* 84*  ALKPHOS 79 74  BILITOT 0.8 0.9  PROT 6.3* 5.6*  ALBUMIN 3.0* 2.6*   No results for input(s): LIPASE, AMYLASE in the last 168 hours. No results for input(s): AMMONIA in the last 168 hours. Coagulation Profile: Recent Labs  Lab 07/16/20 1212  INR 1.2   Cardiac Enzymes: No results for input(s): CKTOTAL, CKMB, CKMBINDEX, TROPONINI in the last 168 hours. BNP (last 3 results) No results for input(s): PROBNP in the last 8760 hours. HbA1C: No results for input(s): HGBA1C in the last 72 hours. CBG: No results for input(s): GLUCAP in the last 168 hours. Lipid  Profile: No results for input(s): CHOL, HDL, LDLCALC, TRIG, CHOLHDL, LDLDIRECT in the last 72 hours. Thyroid Function Tests: No results for input(s): TSH, T4TOTAL, FREET4, T3FREE, THYROIDAB in the last 72 hours. Anemia Panel: No results for input(s): VITAMINB12, FOLATE, FERRITIN, TIBC, IRON, RETICCTPCT in the last 72 hours. Sepsis Labs: Recent Labs  Lab 07/16/20 1212 07/17/20 0307 07/18/20 0411  PROCALCITON 0.11 0.26 2.55  LATICACIDVEN 0.9  --   --     Recent Results (from the past 240 hour(s))  Resp Panel by RT-PCR (Flu A&B, Covid) Nasopharyngeal Swab     Status: None   Collection Time: 07/11/20  3:53 PM   Specimen: Nasopharyngeal Swab; Nasopharyngeal(NP) swabs in vial transport medium  Result Value Ref Range Status   SARS Coronavirus 2 by RT PCR NEGATIVE NEGATIVE Final    Comment: (NOTE) SARS-CoV-2 target nucleic acids are NOT DETECTED.  The SARS-CoV-2 RNA is generally detectable in upper respiratory specimens during the acute phase of infection. The lowest concentration of SARS-CoV-2 viral copies this assay can detect is 138 copies/mL. A negative result does not preclude SARS-Cov-2 infection and should not be used as the sole basis for treatment or other patient management decisions. A negative result may occur with  improper specimen collection/handling, submission of specimen other than nasopharyngeal swab, presence of viral mutation(s) within the areas targeted by this assay, and inadequate number of viral copies(<138 copies/mL). A negative result must be combined with clinical observations, patient history, and epidemiological information. The expected result is Negative.  Fact Sheet for Patients:  EntrepreneurPulse.com.au  Fact Sheet for Healthcare Providers:  IncredibleEmployment.be  This test is no t yet approved or cleared by the [place] FDA and  has been authorized for detection and/or diagnosis of SARS-CoV-2 by FDA  under an Emergency Use Authorization (EUA). This EUA will remain  in effect (meaning this test can be used) for the duration of the COVID-19 declaration under Section 564(b)(1) of the Act, 21 U.S.C.section 360bbb-3(b)(1), unless the authorization is terminated  or revoked sooner.       Influenza A by PCR NEGATIVE NEGATIVE Final   Influenza B by PCR NEGATIVE NEGATIVE Final    Comment: (NOTE) The Xpert Xpress SARS-CoV-2/FLU/RSV plus assay is intended as an aid in the diagnosis of influenza from Nasopharyngeal swab specimens and should not be used as a sole basis for treatment. Nasal washings and aspirates are unacceptable for Xpert Xpress SARS-CoV-2/FLU/RSV testing.  Fact Sheet for Patients: EntrepreneurPulse.com.au  Fact Sheet for Healthcare Providers: IncredibleEmployment.be  This test is not yet approved or  cleared by the [place] and has been authorized for detection and/or diagnosis of SARS-CoV-2 by FDA under an Emergency Use Authorization (EUA). This EUA will remain in effect (meaning this test can be used) for the duration of the COVID-19 declaration under Section 564(b)(1) of the Act, 21 U.S.C. section 360bbb-3(b)(1), unless the authorization is terminated or revoked.  Performed at [facility], [address] [place], [place] [address]   Resp Panel by RT-PCR (Flu A&B, Covid) Nasopharyngeal Swab     Status: None   Collection Time: 07/16/20 12:12 PM   Specimen: Nasopharyngeal Swab; Nasopharyngeal(NP) swabs in vial transport medium  Result Value Ref Range Status   SARS Coronavirus 2 by RT PCR NEGATIVE NEGATIVE Final    Comment: (NOTE) SARS-CoV-2 target nucleic acids are NOT DETECTED.  The SARS-CoV-2 RNA is generally detectable in upper respiratory specimens during the acute phase of infection. The lowest concentration of SARS-CoV-2 viral copies this assay can detect is 138 copies/mL. A negative result does not  preclude SARS-Cov-2 infection and should not be used as the sole basis for treatment or other patient management decisions. A negative result may occur with  improper specimen collection/handling, submission of specimen other than nasopharyngeal swab, presence of viral mutation(s) within the areas targeted by this assay, and inadequate number of viral copies(<138 copies/mL). A negative result must be combined with clinical observations, patient history, and epidemiological information. The expected result is Negative.  Fact Sheet for Patients:  EntrepreneurPulse.com.au  Fact Sheet for Healthcare Providers:  IncredibleEmployment.be  This test is no t yet approved or cleared by the [place] FDA and  has been authorized for detection and/or diagnosis of SARS-CoV-2 by FDA under an Emergency Use Authorization (EUA). This EUA will remain  in effect (meaning this test can be used) for the duration of the COVID-19 declaration under Section 564(b)(1) of the Act, 21 U.S.C.section 360bbb-3(b)(1), unless the authorization is terminated  or revoked sooner.       Influenza A by PCR NEGATIVE NEGATIVE Final   Influenza B by PCR NEGATIVE NEGATIVE Final    Comment: (NOTE) The Xpert Xpress SARS-CoV-2/FLU/RSV plus assay is intended as an aid in the diagnosis of influenza from Nasopharyngeal swab specimens and should not be used as a sole basis for treatment. Nasal washings and aspirates are unacceptable for Xpert Xpress SARS-CoV-2/FLU/RSV testing.  Fact Sheet for Patients: EntrepreneurPulse.com.au  Fact Sheet for Healthcare Providers: IncredibleEmployment.be  This test is not yet approved or cleared by the [place] FDA and has been authorized for detection and/or diagnosis of SARS-CoV-2 by FDA under an Emergency Use Authorization (EUA). This EUA will remain in effect (meaning this test can be used) for the  duration of the COVID-19 declaration under Section 564(b)(1) of the Act, 21 U.S.C. section 360bbb-3(b)(1), unless the authorization is terminated or revoked.  Performed at [facility], [address] [place], [place] [address]   Blood Culture (routine x 2)     Status: None (Preliminary result)   Collection Time: 07/16/20 12:12 PM   Specimen: BLOOD  Result Value Ref Range Status   Specimen Description   Final    BLOOD Blood Culture results may not be optimal due to an inadequate volume of blood received in culture bottles   Special Requests   Final    BOTTLES DRAWN AEROBIC AND ANAEROBIC RIGHT ANTECUBITAL   Culture   Final    NO GROWTH 4 DAYS Performed at [facility], [address] [place], [place]  [address]    Report Status PENDING  Incomplete  Blood Culture (routine x 2)     Status: None (Preliminary result)   Collection Time: 07/16/20 12:12 PM   Specimen: BLOOD  Result Value Ref Range Status   Specimen Description   Final    BLOOD Blood Culture results may not be optimal due to an inadequate volume of blood received in culture bottles   Special Requests   Final    BOTTLES DRAWN AEROBIC AND ANAEROBIC BLOOD LEFT HAND   Culture   Final    NO GROWTH 4 DAYS Performed at [facility], [address], [place], [place] [address]    Report Status PENDING  Incomplete  Urine culture     Status: None   Collection Time: 07/16/20 12:12 PM   Specimen: Urine, Random  Result Value Ref Range Status   Specimen Description   Final    URINE, RANDOM Performed at [facility], [address] [place], [place] [address]    Special Requests   Final    NONE Performed at [facility], [address], [place], [place] [address]    Culture   Final    NO GROWTH Performed at [facility], [place] [address] [place], [place] [address]    Report Status 07/17/2020 FINAL  Final         Radiology Studies: No results  found.      Scheduled Meds: . aspirin  81 mg Oral Daily  . azithromycin  500 mg Oral Daily  . busPIRone  15 mg Oral TID  . cefdinir  300 mg Oral Q12H  . dextromethorphan-guaiFENesin  1 tablet Oral BID  . donepezil  5 mg Oral QHS  . enoxaparin (LOVENOX) injection  40 mg Subcutaneous Q24H  . FLUoxetine  20 mg Oral BID  . loratadine  10 mg Oral Daily  . memantine  5 mg Oral BID  . metoprolol tartrate  25 mg Oral BID  . multivitamin with minerals  1 tablet Oral Daily  . pravastatin  20 mg Oral q1800  . risperiDONE  0.5 mg Oral BID  . vitamin B-12  1,000 mcg Oral Daily   Continuous Infusions:   LOS: 3 days    Time spent: 31 mins     Wyvonnia Dusky, MD [facility] Pager [phone]  If 7PM-7AM, please contact night-coverage 07/20/2020, 7:34 AM

## 2020-07-20 NOTE — Progress Notes (Deleted)
Daily Progress Note   Patient Name: Lindsay Chung       Date: 07/20/2020 DOB: 05-18-1940  Age: 80 y.o. MRN#: [id number] Attending Physician: Wyvonnia Dusky, MD Primary Care Physician: Tracie Harrier, MD Admit Date: 07/16/2020  Reason for Consultation/Follow-up: Establishing goals of care  Subjective: Patient is resting in bed. She is able to answer basic questions, but looks to her husband to answer most of them. He states she has had cognitive/memory impairment for around 2 years. He states she cooks and he remains close by; he does other chores. He states she does her ADL's independently. He states her oral intake has not bee good.   We discussed her diagnoses, prognosis, GOC, EOL wishes disposition and options.  A detailed discussion was had today regarding advanced directives.  Concepts specific to code status, artifical feeding and hydration, IV antibiotics and rehospitalization were discussed.  The difference between an aggressive medical intervention path and a comfort care path was discussed.  Values and goals of care important to patient and family were attempted to be elicited.  Discussed limitations of medical interventions to prolong quality of life in some situations and discussed the concept of human mortality.  He states all of their advanced care planning has been completed. They would like to treat the treatable. He states she would not want CPR and "no machines." He states they would not want a ventilator, dialysis, a feeding tube, or an insulin pump. He states they do have an insurance plan for SNF care and would have an acceptable QOL if they were to lose functional independence. They plan to follow up with endocrinology to further modify medications for her  hyperglycemia.   Length of Stay: 3  Current Medications: Scheduled Meds:  . aspirin  81 mg Oral Daily  . busPIRone  15 mg Oral TID  . cefdinir  300 mg Oral Q12H  . dextromethorphan-guaiFENesin  1 tablet Oral BID  . donepezil  5 mg Oral QHS  . enoxaparin (LOVENOX) injection  40 mg Subcutaneous Q24H  . FLUoxetine  20 mg Oral BID  . loratadine  10 mg Oral Daily  . memantine  5 mg Oral BID  . metoprolol tartrate  25 mg Oral BID  . multivitamin with minerals  1 tablet Oral Daily  . pravastatin  20 mg  Oral q1800  . risperiDONE  0.5 mg Oral BID  . vitamin B-12  1,000 mcg Oral Daily    Continuous Infusions:   PRN Meds: acetaminophen **OR** acetaminophen, ALPRAZolam, ipratropium-albuterol, labetalol, ondansetron **OR** ondansetron (ZOFRAN) IV  Physical Exam Pulmonary:     Effort: Pulmonary effort is normal.  Neurological:     Mental Status: She is alert.             Vital Signs: BP (!) 147/79 (BP Location: Left Arm)   Pulse (!) 54   Temp 98.3 F (36.8 C)   Resp 18   Ht 5\' 3"  (1.6 m)   Wt 68 kg   SpO2 90%   BMI 26.56 kg/m  SpO2: SpO2: 90 % O2 Device: O2 Device: Room Air O2 Flow Rate: O2 Flow Rate (L/min): 6 L/min  Intake/output summary:   Intake/Output Summary (Last 24 hours) at 07/20/2020 1201 Last data filed at 07/20/2020 1031 Gross per 24 hour  Intake 0 ml  Output --  Net 0 ml   LBM: Last BM Date: 07/19/20 Baseline Weight: Weight: 68 kg Most recent weight: Weight: 68 kg         Patient Active Problem List   Diagnosis Date Noted  . Bilateral pneumonia 07/18/2020  . Acute respiratory failure with hypoxia ([place]) 07/17/2020  . Sepsis ([place]) 07/16/2020  . Weakness 07/16/2020  . Lewy body dementia with behavioral disturbance ([place]) 07/03/2020  . Hypokalemia 03/27/2019  . Anxiety 03/27/2019  . Dementia without behavioral disturbance ([place]) 03/27/2019  . Hypocalcemia 03/26/2019  . Hypomagnesemia   . Nausea without vomiting   . Poor appetite   . Diarrhea      Palliative Care Assessment & Plan   Recommendations/Plan:  Treat the treatable. No mechanical support of any kind.   Recommend outpatient follow up.  I will f/u Monday if they are still admitted.   Code Status:    Code Status Orders  (From admission, onward)         Start     Ordered   07/16/20 1735  Do not attempt resuscitation (DNR)  Continuous       Question Answer Comment  In the event of cardiac or respiratory ARREST Do not call a "code blue"   In the event of cardiac or respiratory ARREST Do not perform Intubation, CPR, defibrillation or ACLS   In the event of cardiac or respiratory ARREST Use medication by any route, position, wound care, and other measures to relive pain and suffering. May use oxygen, suction and manual treatment of airway obstruction as needed for comfort.      07/16/20 1736        Code Status History    Date Active Date Inactive Code Status Order ID Comments User Context   03/26/2019 1208 03/28/2019 1937 Full Code [id number]  Lorella Nimrod, MD ED   Advance Care Planning Activity    Advance Directive Documentation   Flowsheet Row Most Recent Value  Type of Advance Directive Healthcare Power of Attorney  Pre-existing out of facility DNR order (yellow form or pink MOST form) --  "MOST" Form in Place? --       Prognosis:  Poor overall  Care plan was discussed with RN  Thank you for allowing the Palliative Medicine Team to assist in the care of this patient.   Total Time 45 min Prolonged Time Billed  no       Greater than 50%  of this time was spent counseling and coordinating care related  to the above assessment and plan.  Asencion Gowda, NP  Please contact Palliative Medicine Team phone at [phone] for questions and concerns.

## 2020-07-21 DIAGNOSIS — R627 Adult failure to thrive: Secondary | ICD-10-CM

## 2020-07-21 LAB — CULTURE, BLOOD (ROUTINE X 2)
Culture: NO GROWTH
Culture: NO GROWTH

## 2020-07-21 NOTE — Discharge Summary (Signed)
Physician Discharge Summary  Lindsay Chung OFH:[id number] DOB: [birth year] DOA: 07/16/2020  PCP: Tracie Harrier, MD  Admit date: 07/16/2020 Discharge date: 07/21/2020  Admitted From: home  Disposition: hospice facility   Recommendations for Outpatient Follow-up:  1. Follow up with hospice provider ASAP   Home Health: no  Equipment/Devices:  Discharge Condition: hospice  CODE STATUS: DNR/comfort care  Diet recommendation: as tolerated   Brief/Interim Summary: HPI was taken from Dr. Tobie Poet: Lindsay Chung is a 80 y.o. female with medical history significant for Lewy body dementia, hyperlipidemia, depression, psychiatric imbalance, anxiety, GERD, hypertension, presents to the emergency department for chief concerns of lethargy.  Husband at bedside states that patient had fever at home, however T-max was not known as husband who is at bedside was at church, and this report was per daughter.  Spouse at bedside reports that family can no longer take care of patient and with previous hospitalization, family was looking for a long-term facility for patient however there was no availability anywhere or patient was on excepted anywhere in the only place that accepted patient was not highly recommended and not acceptable to family.  Per spouse, "I am not just going to throw her into a hole and leave her there ".  We want her to be able to go to a place that will look after her well.  At bedside, patient was able to tell me her name, mumbled her age of 80.  She does not know she is in the hospital.  She does open her eyes spontaneously and will talk with loud verbal stimuli.  Social history: lives at home with spouse and has help from daughter. She is a former tobacco user, quit about 1 year ago. She used to smoke 2-3 cigarettes per day. She does not drink etoh.  Vaccinations: 3 shots for covid 19  ROS: Unable to complete as patient has advanced dementia   Hospital course from Dr.  Jimmye Norman 4/20-4/22/22: Pt was found to have severe sepsis secondary to pneumonia and was initially put on IV abxs and then switch to po cefdinir, azithromycin. Pt did also require supplemental oxygen while inpatient but this was weaned off prior to d/c. Pt has multiple co-morbidities including lewy body dementia and pt recently stopped eating as well. After palliative care met w/ pt's family and pt's family decided to proceed with comfort care only. For more information, please see previous progress/consult notes.   Discharge Diagnoses:  Principal Problem:   Sepsis ([place]) Active Problems:   Anxiety   Dementia without behavioral disturbance ([place])   Lewy body dementia with behavioral disturbance ([facility])   Weakness   Acute respiratory failure with hypoxia ([facility])   Bilateral pneumonia   Failure to thrive: secondary to all below. Pt has stop eating as well. Will continue w/ comfort care only   Severe sepsis: secondary to pneumonia.. Blood cxs grew NGTD. Resolved   Pneumonia: continue w/ comfort care only   Acute hypoxic respiratory failure: resolved   Acute metabolic encephalopathy: labile. Likely secondary to dementia   Lewy body dementia: continue w/ comfort care only   Hyponatremia: stable.   Hypokalemia: no longer getting labs for this.  Hypomagnesemia: resolved   Discharge Instructions  Discharge Instructions    Diet general   Complete by: As directed    Discharge instructions   Complete by: As directed    F/u w/ hospice provider ASAP   Increase activity slowly   Complete by: As directed  Allergies as of 07/21/2020      Reactions   Amoxicillin Other (See Comments)   Patient states that she can take this medication.       Medication List    TAKE these medications   acetaminophen 500 MG tablet Commonly known as: TYLENOL Take 1,000 mg by mouth every 6 (six) hours as needed for mild pain.   ALPRAZolam 0.5 MG tablet Commonly known as: XANAX Take 0.5 mg by  mouth 3 (three) times daily.   ALPRAZolam 1 MG tablet Commonly known as: XANAX Take 1 mg by mouth 3 (three) times daily as needed.   aspirin 81 MG tablet Take 81 mg by mouth daily.   busPIRone 15 MG tablet Commonly known as: BUSPAR Take 15 mg by mouth 3 (three) times daily.   busPIRone 10 MG tablet Commonly known as: BUSPAR Take 10 mg by mouth in the morning, at noon, and at bedtime.   calcium-vitamin D 500-200 MG-UNIT tablet Commonly known as: OSCAL WITH D Take 1 tablet by mouth 2 (two) times daily.   cetirizine 10 MG tablet Commonly known as: ZYRTEC Take 10 mg by mouth daily.   clobetasol cream 0.05 % Commonly known as: TEMOVATE Apply 1 application topically daily as needed.   donepezil 5 MG tablet Commonly known as: ARICEPT Take 5 mg by mouth daily.   FLUoxetine 20 MG capsule Commonly known as: PROZAC Take 1 capsule (20 mg total) by mouth 2 (two) times daily. What changed:   how much to take  when to take this  additional instructions   fluvastatin 40 MG capsule Commonly known as: LESCOL Take 40 mg by mouth daily.   memantine 5 MG tablet Commonly known as: NAMENDA Take 5 mg by mouth daily.   metoprolol tartrate 25 MG tablet Commonly known as: LOPRESSOR Take 25 mg by mouth 2 (two) times daily.   multivitamin tablet Take 1 tablet by mouth daily.   omeprazole 20 MG capsule Commonly known as: PRILOSEC Take 20 mg by mouth daily.   risperiDONE 0.25 MG tablet Commonly known as: RISPERDAL Take 0.5 mg by mouth in the morning, at noon, and at bedtime. 1 tablet in am and noon and 2 tablets   tetrahydrozoline 0.05 % ophthalmic solution Place 1 drop into both eyes daily.   vitamin B-12 1000 MCG tablet Commonly known as: CYANOCOBALAMIN Take 1,000 mcg by mouth daily.   Vitamin D 50 MCG (2000 UT) tablet Take 2,000 Units by mouth daily.       Allergies  Allergen Reactions  . Amoxicillin Other (See Comments)    Patient states that she can take this  medication.     Consultations:  Palliative care/hospice      Procedures/Studies: DG Chest 2 View  Result Date: 07/16/2020 CLINICAL DATA:  Questionable sepsis. EXAM: CHEST - 2 VIEW COMPARISON:  July 02, 2020 FINDINGS: The heart size and mediastinal contours are stable. Heart size is mildly enlarged. Mild increased pulmonary interstitium is identified bilaterally. No focal pneumonia or pleural effusion is noted. The visualized skeletal structures are unremarkable. IMPRESSION: Mild congestive heart failure. Electronically Signed   By: Abelardo Diesel M.D.   On: 07/16/2020 14:08   DG Chest 2 View  Result Date: 07/02/2020 CLINICAL DATA:  Altered mental status. EXAM: CHEST - 2 VIEW COMPARISON:  03/26/2019 FINDINGS: Patient slightly rotated to the left. Lungs are adequately inflated without focal airspace consolidation or effusion. Increased focal density projecting over the anterior mid lungs extending to the hilar region seen only  on the lateral film of uncertain clinical significance. Cardiomediastinal silhouette is normal. Remainder the exam is unchanged. IMPRESSION: 1. No acute cardiopulmonary disease. 2. Increased focal density projecting over the anterior mid lungs extending to the hilar region seen only on the lateral film. Recommend contrast-enhanced chest CT for further evaluation. Electronically Signed   By: Marin Olp M.D.   On: 07/02/2020 15:19   CT Head Wo Contrast  Result Date: 07/16/2020 CLINICAL DATA:  Altered mental status. EXAM: CT HEAD WITHOUT CONTRAST TECHNIQUE: Contiguous axial images were obtained from the base of the skull through the vertex without intravenous contrast. COMPARISON:  July 11, 2020 FINDINGS: Brain: No evidence of acute infarction, hemorrhage, hydrocephalus, extra-axial collection or mass lesion/mass effect. There is chronic diffuse atrophy. Chronic bilateral periventricular white matter small vessel ischemic changes are noted. Vascular: No hyperdense vessel  noted. Skull: Normal. Negative for fracture or focal lesion. Sinuses/Orbits: No acute finding. Other: None. IMPRESSION: 1. No focal acute intracranial abnormality identified. 2. Chronic diffuse atrophy. Chronic bilateral periventricular white matter small vessel ischemic change. Electronically Signed   By: Abelardo Diesel M.D.   On: 07/16/2020 14:01   CT Head Wo Contrast  Result Date: 07/11/2020 CLINICAL DATA:  Dementia.  Mental status change. EXAM: CT HEAD WITHOUT CONTRAST TECHNIQUE: Contiguous axial images were obtained from the base of the skull through the vertex without intravenous contrast. COMPARISON:  07/02/2020 FINDINGS: Brain: Cerebellar and cerebral atrophy is likely age appropriate. Mild for age low density in the periventricular white matter likely related to small vessel disease. No mass lesion, hemorrhage, hydrocephalus, acute infarct, intra-axial, or extra-axial fluid collection. Vascular: Intracranial atherosclerosis. No hyperdense vessel or unexpected calcification. Skull: No significant soft tissue swelling.  No skull fracture. Sinuses/Orbits: Normal imaged portions of the orbits and globes. Clear paranasal sinuses and mastoid air cells. Other: None. IMPRESSION: 1. No acute intracranial abnormality. 2. Cerebral/cerebellar atrophy and small vessel ischemic change. Electronically Signed   By: Abigail Miyamoto M.D.   On: 07/11/2020 16:14   CT Head Wo Contrast  Result Date: 07/02/2020 CLINICAL DATA:  80 year old female with altered mental status. EXAM: CT HEAD WITHOUT CONTRAST TECHNIQUE: Contiguous axial images were obtained from the base of the skull through the vertex without intravenous contrast. COMPARISON:  Head CT dated 03/26/2019. FINDINGS: Brain: Mild age-related atrophy and chronic microvascular ischemic changes. There is no acute intracranial hemorrhage. No mass effect or midline shift. No extra-axial fluid collection. Vascular: No hyperdense vessel or unexpected calcification. Skull:  Normal. Negative for fracture or focal lesion. Sinuses/Orbits: No acute finding. Other: None IMPRESSION: 1. No acute intracranial pathology. 2. Mild age-related atrophy and chronic microvascular ischemic changes. Electronically Signed   By: Anner Crete M.D.   On: 07/02/2020 15:09   CT CHEST WO CONTRAST  Result Date: 07/17/2020 CLINICAL DATA:  Respiratory failure, lethargy, increased work of breathing. EXAM: CT CHEST WITHOUT CONTRAST TECHNIQUE: Multidetector CT imaging of the chest was performed following the standard protocol without IV contrast. COMPARISON:  07/02/2020. FINDINGS: Cardiovascular: Atherosclerotic calcification of the aorta, aortic valve and coronary arteries. Pulmonic trunk and heart are enlarged. No pericardial effusion. Mediastinum/Nodes: No pathologically enlarged mediastinal or axillary lymph nodes. Hilar regions are difficult to definitively evaluate without IV contrast. Esophagus is grossly unremarkable. Lungs/Pleura: Image quality is rather degraded by expiratory phase imaging and respiratory motion. New patchy areas of ground-glass bilaterally with dependent atelectasis. Associated smooth septal thickening. No pleural fluid. Debris is seen in the airway. Upper Abdomen: Low-attenuation lesions in the liver measure up to  1.6 cm in the dome, as before. Further characterization is limited without post-contrast imaging or due to size. Visualized portions of the liver and gallbladder are otherwise unremarkable. Nodular thickening of the right adrenal gland. Adreniform thickening of the left adrenal gland. Stones in the kidneys bilaterally. There may be a subcentimeter exophytic lesion off the interpolar left kidney, incompletely visualized. Visualized portions of the kidneys, spleen, pancreas, stomach and bowel are otherwise unremarkable. No upper abdominal adenopathy. Musculoskeletal: Degenerative changes in the spine and shoulders. IMPRESSION: 1. Image quality is degraded by expiratory  phase imaging and respiratory motion. Mild pulmonary edema. Difficult to definitively exclude pneumonia. 2. Bilateral renal stones. 3. Aortic atherosclerosis (ICD10-I70.0). Coronary artery calcification. 4. Enlarged pulmonic trunk, indicative of pulmonary arterial hypertension. Electronically Signed   By: Lorin Picket M.D.   On: 07/17/2020 13:36   CT Chest W Contrast  Result Date: 07/02/2020 CLINICAL DATA:  Possible pneumonia, effusion or abscess. Chest x-ray abnormality. EXAM: CT CHEST WITH CONTRAST TECHNIQUE: Multidetector CT imaging of the chest was performed during intravenous contrast administration. CONTRAST:  74mL OMNIPAQUE IOHEXOL 300 MG/ML  SOLN COMPARISON:  Chest x-ray today.  CT abdomen 03/26/2019. FINDINGS: Cardiovascular: Mild cardiomegaly. Calcified plaque over the left main and 3 vessel coronary arteries. Thoracic aorta is normal in caliber. Mild calcified plaque over the thoracic aorta. Visualized pulmonary arterial system is normal. Mediastinum/Nodes: No mediastinal or hilar adenopathy. Remaining mediastinal structures are normal. Lungs/Pleura: Lungs are adequately inflated without focal airspace consolidation or effusion. Minimal patchy linear atelectasis/scarring over the mid to lower lungs. No focal abnormality to correspond to the chest radiograph findings. Airways are normal. Upper Abdomen: Several liver cysts are present unchanged. Calcified plaque over the abdominal aorta. Diverticulosis of the colon. No acute findings. Musculoskeletal: Degenerative change of the spine. IMPRESSION: 1. No acute cardiopulmonary disease. Minimal patchy linear atelectasis/scarring over the mid to lower lungs. No concerning abnormality to correspond to the chest radiograph findings over the anterior mid lungs. 2. Aortic atherosclerosis. Atherosclerotic coronary artery disease. 3. Several liver cysts unchanged. 4. Colonic diverticulosis. Aortic Atherosclerosis (ICD10-I70.0). Electronically Signed   By: Marin Olp M.D.   On: 07/02/2020 16:45   MR BRAIN WO CONTRAST  Result Date: 07/16/2020 CLINICAL DATA:  Neuro deficit, acute, stroke suspected. Additional history provided: Weakness. EXAM: MRI HEAD WITHOUT CONTRAST TECHNIQUE: Multiplanar, multiecho pulse sequences of the brain and surrounding structures were obtained without intravenous contrast. COMPARISON:  Prior head CT examinations 07/16/2020 and earlier. Brain MRI 02/09/2019. FINDINGS: Brain: Mild cerebral and cerebellar atrophy. Redemonstrated chronic lacunar infarcts within the right caudate head, bilateral thalami and bilateral cerebellar hemispheres. Background mild multifocal T2/FLAIR hyperintensity within the cerebral white matter and within the pons, nonspecific but compatible with chronic small vessel ischemic disease. There is no acute infarct. No evidence of intracranial mass. No chronic intracranial blood products. No extra-axial fluid collection. No midline shift. Vascular: Expected proximal arterial flow voids. Skull and upper cervical spine: No focal marrow lesion. Sinuses/Orbits: Visualized orbits show no acute finding. No significant paranasal sinus disease. IMPRESSION: No evidence of acute intracranial abnormality. Redemonstrated chronic lacunar infarcts within right caudate head, bilateral thalami and bilateral cerebellar hemispheres. Background mild generalized parenchymal atrophy and chronic small vessel ischemic disease, stable as compared to the brain MRI of 02/09/2019. Electronically Signed   By: Kellie Simmering DO   On: 07/16/2020 19:20   CT Abdomen Pelvis W Contrast  Result Date: 07/16/2020 CLINICAL DATA:  Abdominal abscess, generalized weakness EXAM: CT ABDOMEN AND PELVIS WITH CONTRAST TECHNIQUE: Multidetector  CT imaging of the abdomen and pelvis was performed using the standard protocol following bolus administration of intravenous contrast. CONTRAST:  62mL OMNIPAQUE IOHEXOL 350 MG/ML SOLN COMPARISON:  03/26/2019 FINDINGS: Lower  chest: Mild bibasilar atelectasis. The visualized heart and pericardium are unremarkable. Small hiatal hernia. Hepatobiliary: Multiple cysts are seen scattered throughout the liver, unchanged from prior examination. The liver is otherwise unremarkable. Gallbladder unremarkable. No intra or extrahepatic biliary ductal dilation. Pancreas: Unremarkable Spleen: Unremarkable Adrenals/Urinary Tract: The adrenal glands are unremarkable. The kidneys are normal in size and position. 5 mm nonobstructing calculus noted within the interpolar region of the left kidney. The kidneys are otherwise unremarkable. The bladder is unremarkable. Stomach/Bowel: Severe descending and sigmoid colonic diverticulosis. The stomach, small bowel, and large bowel are otherwise unremarkable there is no evidence of obstruction or focal inflammation. The appendix is normal. There is no free intraperitoneal gas or fluid. Vascular/Lymphatic: Extensive aortoiliac atherosclerotic calcification. No aortic aneurysm. No pathologic adenopathy within the abdomen and pelvis. Reproductive: Uterus and bilateral adnexa are unremarkable. Other: Tiny fat containing umbilical hernia.  Rectum unremarkable. Musculoskeletal: Osseous structures are age-appropriate. No lytic or blastic bone lesions. No acute bone abnormality. IMPRESSION: No acute intra-abdominal pathology. No definite radiographic explanation for the patient's reported symptoms. Mild left nonobstructing nephrolithiasis. No urolithiasis. No hydronephrosis. Severe distal colonic diverticulosis without superimposed inflammatory change. Aortic Atherosclerosis (ICD10-I70.0). Electronically Signed   By: Fidela Salisbury MD   On: 07/16/2020 16:26       Subjective: Pt c/o fatigue    Discharge Exam: Vitals:   07/20/20 1125 07/20/20 2105  BP: (!) 147/79 (!) 161/73  Pulse: (!) 54 92  Resp: 18 20  Temp: 98.3 F (36.8 C)   SpO2: 90% 95%   Vitals:   07/20/20 0601 07/20/20 0812 07/20/20 1125 07/20/20  2105  BP:  130/80 (!) 147/79 (!) 161/73  Pulse:  100 (!) 54 92  Resp:  $Remo'18 18 20  'JMXyu$ Temp:  99.6 F (37.6 C) 98.3 F (36.8 C)   TempSrc:      SpO2: 94% 96% 90% 95%  Weight:      Height:        General: Pt is alert, awake, not in acute distress Cardiovascular:S1/S2 +, no rubs, no gallops Respiratory: diminished breath sounds b/l  Abdominal: Soft, NT, ND, bowel sounds + Extremities: no edema, no cyanosis    The results of significant diagnostics from this hospitalization (including imaging, microbiology, ancillary and laboratory) are listed below for reference.     Microbiology: Recent Results (from the past 240 hour(s))  Resp Panel by RT-PCR (Flu A&B, Covid) Nasopharyngeal Swab     Status: None   Collection Time: 07/11/20  3:53 PM   Specimen: Nasopharyngeal Swab; Nasopharyngeal(NP) swabs in vial transport medium  Result Value Ref Range Status   SARS Coronavirus 2 by RT PCR NEGATIVE NEGATIVE Final    Comment: (NOTE) SARS-CoV-2 target nucleic acids are NOT DETECTED.  The SARS-CoV-2 RNA is generally detectable in upper respiratory specimens during the acute phase of infection. The lowest concentration of SARS-CoV-2 viral copies this assay can detect is 138 copies/mL. A negative result does not preclude SARS-Cov-2 infection and should not be used as the sole basis for treatment or other patient management decisions. A negative result may occur with  improper specimen collection/handling, submission of specimen other than nasopharyngeal swab, presence of viral mutation(s) within the areas targeted by this assay, and inadequate number of viral copies(<138 copies/mL). A negative result must be combined with clinical observations, patient  history, and epidemiological information. The expected result is Negative.  Fact Sheet for Patients:  EntrepreneurPulse.com.au  Fact Sheet for Healthcare Providers:  IncredibleEmployment.be  This test is no  t yet approved or cleared by the [place] FDA and  has been authorized for detection and/or diagnosis of SARS-CoV-2 by FDA under an Emergency Use Authorization (EUA). This EUA will remain  in effect (meaning this test can be used) for the duration of the COVID-19 declaration under Section 564(b)(1) of the Act, 21 U.S.C.section 360bbb-3(b)(1), unless the authorization is terminated  or revoked sooner.       Influenza A by PCR NEGATIVE NEGATIVE Final   Influenza B by PCR NEGATIVE NEGATIVE Final    Comment: (NOTE) The Xpert Xpress SARS-CoV-2/FLU/RSV plus assay is intended as an aid in the diagnosis of influenza from Nasopharyngeal swab specimens and should not be used as a sole basis for treatment. Nasal washings and aspirates are unacceptable for Xpert Xpress SARS-CoV-2/FLU/RSV testing.  Fact Sheet for Patients: EntrepreneurPulse.com.au  Fact Sheet for Healthcare Providers: IncredibleEmployment.be  This test is not yet approved or cleared by the [place] FDA and has been authorized for detection and/or diagnosis of SARS-CoV-2 by FDA under an Emergency Use Authorization (EUA). This EUA will remain in effect (meaning this test can be used) for the duration of the COVID-19 declaration under Section 564(b)(1) of the Act, 21 U.S.C. section 360bbb-3(b)(1), unless the authorization is terminated or revoked.  Performed at [facility], [address] [place], [place] [address]   Resp Panel by RT-PCR (Flu A&B, Covid) Nasopharyngeal Swab     Status: None   Collection Time: 07/16/20 12:12 PM   Specimen: Nasopharyngeal Swab; Nasopharyngeal(NP) swabs in vial transport medium  Result Value Ref Range Status   SARS Coronavirus 2 by RT PCR NEGATIVE NEGATIVE Final    Comment: (NOTE) SARS-CoV-2 target nucleic acids are NOT DETECTED.  The SARS-CoV-2 RNA is generally detectable in upper respiratory specimens during the acute phase of  infection. The lowest concentration of SARS-CoV-2 viral copies this assay can detect is 138 copies/mL. A negative result does not preclude SARS-Cov-2 infection and should not be used as the sole basis for treatment or other patient management decisions. A negative result may occur with  improper specimen collection/handling, submission of specimen other than nasopharyngeal swab, presence of viral mutation(s) within the areas targeted by this assay, and inadequate number of viral copies(<138 copies/mL). A negative result must be combined with clinical observations, patient history, and epidemiological information. The expected result is Negative.  Fact Sheet for Patients:  EntrepreneurPulse.com.au  Fact Sheet for Healthcare Providers:  IncredibleEmployment.be  This test is no t yet approved or cleared by the [place] FDA and  has been authorized for detection and/or diagnosis of SARS-CoV-2 by FDA under an Emergency Use Authorization (EUA). This EUA will remain  in effect (meaning this test can be used) for the duration of the COVID-19 declaration under Section 564(b)(1) of the Act, 21 U.S.C.section 360bbb-3(b)(1), unless the authorization is terminated  or revoked sooner.       Influenza A by PCR NEGATIVE NEGATIVE Final   Influenza B by PCR NEGATIVE NEGATIVE Final    Comment: (NOTE) The Xpert Xpress SARS-CoV-2/FLU/RSV plus assay is intended as an aid in the diagnosis of influenza from Nasopharyngeal swab specimens and should not be used as a sole basis for treatment. Nasal washings and aspirates are unacceptable for Xpert Xpress SARS-CoV-2/FLU/RSV testing.  Fact Sheet for Patients: EntrepreneurPulse.com.au  Fact Sheet for  Healthcare Providers: IncredibleEmployment.be  This test is not yet approved or cleared by the [place] and has been authorized for detection and/or diagnosis of SARS-CoV-2  by FDA under an Emergency Use Authorization (EUA). This EUA will remain in effect (meaning this test can be used) for the duration of the COVID-19 declaration under Section 564(b)(1) of the Act, 21 U.S.C. section 360bbb-3(b)(1), unless the authorization is terminated or revoked.  Performed at [facility], [address] [place], [place] [address]   Blood Culture (routine x 2)     Status: None   Collection Time: 07/16/20 12:12 PM   Specimen: BLOOD  Result Value Ref Range Status   Specimen Description   Final    BLOOD Blood Culture results may not be optimal due to an inadequate volume of blood received in culture bottles   Special Requests   Final    BOTTLES DRAWN AEROBIC AND ANAEROBIC RIGHT ANTECUBITAL   Culture   Final    NO GROWTH 5 DAYS Performed at [facility], [address] [place], [place] [address]    Report Status 07/21/2020 FINAL  Final  Blood Culture (routine x 2)     Status: None   Collection Time: 07/16/20 12:12 PM   Specimen: BLOOD  Result Value Ref Range Status   Specimen Description   Final    BLOOD Blood Culture results may not be optimal due to an inadequate volume of blood received in culture bottles   Special Requests   Final    BOTTLES DRAWN AEROBIC AND ANAEROBIC BLOOD LEFT HAND   Culture   Final    NO GROWTH 5 DAYS Performed at [facility], [address] [place], [place] [address]    Report Status 07/21/2020 FINAL  Final  Urine culture     Status: None   Collection Time: 07/16/20 12:12 PM   Specimen: Urine, Random  Result Value Ref Range Status   Specimen Description   Final    URINE, RANDOM Performed at [facility], [address] [place], [place] [address]    Special Requests   Final    NONE Performed at [facility], [address], [place], [place] [address]    Culture   Final    NO GROWTH Performed at [facility], [place] [address], [place], [place] [address]    Report Status  07/17/2020 FINAL  Final     Labs: BNP (last 3 results) Recent Labs    07/17/20 0833  BNP 57.8   Basic Metabolic Panel: Recent Labs  Lab 07/16/20 1212 07/17/20 0307 07/17/20 0833 07/18/20 0411 07/19/20 0410 07/20/20 0525  NA 132*  --  130* 134* 134* 134*  K 3.7  --  3.2* 3.5 3.8 3.4*  CL 97*  --  98 106 105 104  CO2 24  --  18* 21* 20* 21*  GLUCOSE 110*  --  147* 109* 115* 111*  BUN 24*  --  23 33* 35* 35*  CREATININE 1.10*  --  1.26* 1.10* 0.97 0.88  CALCIUM 8.1*  --  7.9* 8.2* 8.1* 8.0*  MG  --  1.3*  --   --  2.5*  --   PHOS  --  3.5  --   --   --   --    Liver Function Tests: Recent Labs  Lab 07/16/20 1212 07/20/20 0525  AST 179* 180*  ALT 65* 84*  ALKPHOS 79 74  BILITOT 0.8 0.9  PROT  6.3* 5.6*  ALBUMIN 3.0* 2.6*   No results for input(s): LIPASE, AMYLASE in the last 168 hours. No results for input(s): AMMONIA in the last 168 hours. CBC: Recent Labs  Lab 07/16/20 1212 07/18/20 0411 07/20/20 0525  WBC 8.1 8.5 9.8  NEUTROABS 6.5 7.0  --   HGB 12.3 11.9* 12.1  HCT 36.6 34.7* 36.4  MCV 86.9 85.5 85.6  PLT 180 154 210   Cardiac Enzymes: No results for input(s): CKTOTAL, CKMB, CKMBINDEX, TROPONINI in the last 168 hours. BNP: Invalid input(s): POCBNP CBG: No results for input(s): GLUCAP in the last 168 hours. D-Dimer No results for input(s): DDIMER in the last 72 hours. Hgb A1c No results for input(s): HGBA1C in the last 72 hours. Lipid Profile No results for input(s): CHOL, HDL, LDLCALC, TRIG, CHOLHDL, LDLDIRECT in the last 72 hours. Thyroid function studies No results for input(s): TSH, T4TOTAL, T3FREE, THYROIDAB in the last 72 hours.  Invalid input(s): FREET3 Anemia work up No results for input(s): VITAMINB12, FOLATE, FERRITIN, TIBC, IRON, RETICCTPCT in the last 72 hours. Urinalysis    Component Value Date/Time   COLORURINE YELLOW (A) 07/16/2020 1212   APPEARANCEUR HAZY (A) 07/16/2020 1212   LABSPEC 1.025 07/16/2020 1212   PHURINE 6.0  07/16/2020 1212   GLUCOSEU NEGATIVE 07/16/2020 1212   HGBUR NEGATIVE 07/16/2020 1212   BILIRUBINUR NEGATIVE 07/16/2020 1212   KETONESUR 20 (A) 07/16/2020 1212   PROTEINUR 100 (A) 07/16/2020 1212   NITRITE NEGATIVE 07/16/2020 1212   LEUKOCYTESUR NEGATIVE 07/16/2020 1212   Sepsis Labs Invalid input(s): PROCALCITONIN,  WBC,  LACTICIDVEN Microbiology Recent Results (from the past 240 hour(s))  Resp Panel by RT-PCR (Flu A&B, Covid) Nasopharyngeal Swab     Status: None   Collection Time: 07/11/20  3:53 PM   Specimen: Nasopharyngeal Swab; Nasopharyngeal(NP) swabs in vial transport medium  Result Value Ref Range Status   SARS Coronavirus 2 by RT PCR NEGATIVE NEGATIVE Final    Comment: (NOTE) SARS-CoV-2 target nucleic acids are NOT DETECTED.  The SARS-CoV-2 RNA is generally detectable in upper respiratory specimens during the acute phase of infection. The lowest concentration of SARS-CoV-2 viral copies this assay can detect is 138 copies/mL. A negative result does not preclude SARS-Cov-2 infection and should not be used as the sole basis for treatment or other patient management decisions. A negative result may occur with  improper specimen collection/handling, submission of specimen other than nasopharyngeal swab, presence of viral mutation(s) within the areas targeted by this assay, and inadequate number of viral copies(<138 copies/mL). A negative result must be combined with clinical observations, patient history, and epidemiological information. The expected result is Negative.  Fact Sheet for Patients:  EntrepreneurPulse.com.au  Fact Sheet for Healthcare Providers:  IncredibleEmployment.be  This test is no t yet approved or cleared by the [place] FDA and  has been authorized for detection and/or diagnosis of SARS-CoV-2 by FDA under an Emergency Use Authorization (EUA). This EUA will remain  in effect (meaning this test can be used) for  the duration of the COVID-19 declaration under Section 564(b)(1) of the Act, 21 U.S.C.section 360bbb-3(b)(1), unless the authorization is terminated  or revoked sooner.       Influenza A by PCR NEGATIVE NEGATIVE Final   Influenza B by PCR NEGATIVE NEGATIVE Final    Comment: (NOTE) The Xpert Xpress SARS-CoV-2/FLU/RSV plus assay is intended as an aid in the diagnosis of influenza from Nasopharyngeal swab specimens and should not be used as a sole basis for treatment. Nasal washings  and aspirates are unacceptable for Xpert Xpress SARS-CoV-2/FLU/RSV testing.  Fact Sheet for Patients: EntrepreneurPulse.com.au  Fact Sheet for Healthcare Providers: IncredibleEmployment.be  This test is not yet approved or cleared by the [place] FDA and has been authorized for detection and/or diagnosis of SARS-CoV-2 by FDA under an Emergency Use Authorization (EUA). This EUA will remain in effect (meaning this test can be used) for the duration of the COVID-19 declaration under Section 564(b)(1) of the Act, 21 U.S.C. section 360bbb-3(b)(1), unless the authorization is terminated or revoked.  Performed at [facility], [address] [place], [place] [address]   Resp Panel by RT-PCR (Flu A&B, Covid) Nasopharyngeal Swab     Status: None   Collection Time: 07/16/20 12:12 PM   Specimen: Nasopharyngeal Swab; Nasopharyngeal(NP) swabs in vial transport medium  Result Value Ref Range Status   SARS Coronavirus 2 by RT PCR NEGATIVE NEGATIVE Final    Comment: (NOTE) SARS-CoV-2 target nucleic acids are NOT DETECTED.  The SARS-CoV-2 RNA is generally detectable in upper respiratory specimens during the acute phase of infection. The lowest concentration of SARS-CoV-2 viral copies this assay can detect is 138 copies/mL. A negative result does not preclude SARS-Cov-2 infection and should not be used as the sole basis for treatment or other patient management  decisions. A negative result may occur with  improper specimen collection/handling, submission of specimen other than nasopharyngeal swab, presence of viral mutation(s) within the areas targeted by this assay, and inadequate number of viral copies(<138 copies/mL). A negative result must be combined with clinical observations, patient history, and epidemiological information. The expected result is Negative.  Fact Sheet for Patients:  EntrepreneurPulse.com.au  Fact Sheet for Healthcare Providers:  IncredibleEmployment.be  This test is no t yet approved or cleared by the [place] FDA and  has been authorized for detection and/or diagnosis of SARS-CoV-2 by FDA under an Emergency Use Authorization (EUA). This EUA will remain  in effect (meaning this test can be used) for the duration of the COVID-19 declaration under Section 564(b)(1) of the Act, 21 U.S.C.section 360bbb-3(b)(1), unless the authorization is terminated  or revoked sooner.       Influenza A by PCR NEGATIVE NEGATIVE Final   Influenza B by PCR NEGATIVE NEGATIVE Final    Comment: (NOTE) The Xpert Xpress SARS-CoV-2/FLU/RSV plus assay is intended as an aid in the diagnosis of influenza from Nasopharyngeal swab specimens and should not be used as a sole basis for treatment. Nasal washings and aspirates are unacceptable for Xpert Xpress SARS-CoV-2/FLU/RSV testing.  Fact Sheet for Patients: EntrepreneurPulse.com.au  Fact Sheet for Healthcare Providers: IncredibleEmployment.be  This test is not yet approved or cleared by the [place] FDA and has been authorized for detection and/or diagnosis of SARS-CoV-2 by FDA under an Emergency Use Authorization (EUA). This EUA will remain in effect (meaning this test can be used) for the duration of the COVID-19 declaration under Section 564(b)(1) of the Act, 21 U.S.C. section 360bbb-3(b)(1), unless the  authorization is terminated or revoked.  Performed at [facility], [address] [place], [place] [address]   Blood Culture (routine x 2)     Status: None   Collection Time: 07/16/20 12:12 PM   Specimen: BLOOD  Result Value Ref Range Status   Specimen Description   Final    BLOOD Blood Culture results may not be optimal due to an inadequate volume of blood received in culture bottles   Special Requests   Final    BOTTLES DRAWN AEROBIC  AND ANAEROBIC RIGHT ANTECUBITAL   Culture   Final    NO GROWTH 5 DAYS Performed at [facility], [address] [place], [place] [address]    Report Status 07/21/2020 FINAL  Final  Blood Culture (routine x 2)     Status: None   Collection Time: 07/16/20 12:12 PM   Specimen: BLOOD  Result Value Ref Range Status   Specimen Description   Final    BLOOD Blood Culture results may not be optimal due to an inadequate volume of blood received in culture bottles   Special Requests   Final    BOTTLES DRAWN AEROBIC AND ANAEROBIC BLOOD LEFT HAND   Culture   Final    NO GROWTH 5 DAYS Performed at [facility], [address] [place], [place] [address]    Report Status 07/21/2020 FINAL  Final  Urine culture     Status: None   Collection Time: 07/16/20 12:12 PM   Specimen: Urine, Random  Result Value Ref Range Status   Specimen Description   Final    URINE, RANDOM Performed at [facility], [address], [place], [place] [address]    Special Requests   Final    NONE Performed at [facility], [address], [place], [place] [address]    Culture   Final    NO GROWTH Performed at [facility], [place] [address] [place], [place] [address]    Report Status 07/17/2020 FINAL  Final     Time coordinating discharge: Over 30 minutes  SIGNED:   Wyvonnia Dusky, MD  [facility] 07/21/2020, 11:15 AM Pager   If 7PM-7AM, please contact night-coverage

## 2020-07-21 NOTE — Progress Notes (Signed)
SLP Cancellation Note  Patient Details Name: Lindsay Chung MRN: [id number] DOB: 1941-02-03   Cancelled treatment:       Reason Eval/Treat Not Completed:  (chart reviewed). Per chart notes, pt continues w/ a regular consistency diet w/ thin liquids w/ general aspiration precautions; no reports of overt s/s of aspiration. Per notes, however, pt is not taking much oral intake; family has opted for Comfort Care status. Pt is scheduled to D/C to Hospice Home this PM.  ST services will sign off at this time w/ NSG to reconsult if any new needs while admitted. Recommend frequent oral care for hygiene and comfort.      Orinda Kenner, MS, CCC-SLP Speech Language Pathologist Rehab Services [phone] [facility] 07/21/2020, 12:04 PM

## 2020-07-21 NOTE — TOC Progression Note (Signed)
Transition of Care [facility]) - Progression Note    Patient Details  Name: Lindsay Chung MRN: [id number] Date of Birth: September 10, 1940  Transition of Care [facility]) CM/SW [place], RN Phone Number: 07/21/2020, 11:03 AM  Clinical Narrative:    Damaris Schooner with [facility] and arrange transport to the Hospice facility at 830 PM, DC packet on the chart       Expected Discharge Plan and Services                                                 Social Determinants of Health (SDOH) Interventions    Readmission Risk Interventions No flowsheet data found.

## 2020-07-21 NOTE — Progress Notes (Signed)
Discharge: EMS arrived to floor at 9:15PM to transport patient to Hospice house. Patient is on comfort care and is stable for discharge at this time. Discharge AVS reviewed with family at bedside. Report given to EMS and patient left floor at 9:20PM

## 2020-07-21 NOTE — Plan of Care (Signed)
  Problem: Education: Goal: Knowledge of General Education information will improve Description: Including pain rating scale, medication(s)/side effects and non-pharmacologic comfort measures Outcome: Adequate for Discharge   Problem: Clinical Measurements: Goal: Will remain free from infection Outcome: Adequate for Discharge   Problem: Activity: Goal: Risk for activity intolerance will decrease Outcome: Adequate for Discharge   Problem: Coping: Goal: Level of anxiety will decrease Outcome: Adequate for Discharge   Problem: Pain Managment: Goal: General experience of comfort will improve Outcome: Adequate for Discharge   Problem: Safety: Goal: Ability to remain free from injury will improve Outcome: Adequate for Discharge   Problem: Skin Integrity: Goal: Risk for impaired skin integrity will decrease Outcome: Adequate for Discharge   Problem: Skin Integrity: Goal: Risk for impaired skin integrity will decrease Outcome: Adequate for Discharge

## 2020-07-21 NOTE — Progress Notes (Signed)
[facility]) Hospital Liaison RN note:  [facility] does have a room to offer today. Spoke with spouse, Barnabas Lister at bedside and he will be signing consents at the [facility] at 11:30. Transport has been arranged for 8:30pm. I will fax the discharge summary to the Hospice Home once it is available.  Please call with any hospice related questions or concerns.  Thank you for the opportunity to participate in this patient's care.  Zandra Abts, RN [facility] Liaison [phone]

## 2023-01-13 IMAGING — MR MR HEAD W/O CM
13 series · 46 of 48 positions shown · non-contrast
Comparison: Prior head CT examinations 07/16/2020 and earlier.
Brain MRI 02/09/2019.

CLINICAL DATA: Neuro deficit, acute, stroke suspected. Additional
history provided: Weakness.

EXAM:
MRI HEAD WITHOUT CONTRAST
TECHNIQUE: Multiplanar, multiecho pulse sequences of the brain and surrounding
structures were obtained without intravenous contrast.

[Series 5: ax dwi_tracew · axial · 3.0mm · 0.65mm/px · z∈[-82,+63]mm · 5 of 96 slices shown]
[im 1/96]
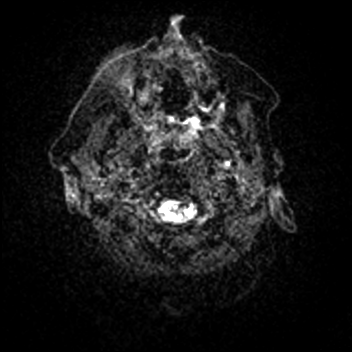
[im 24/96]
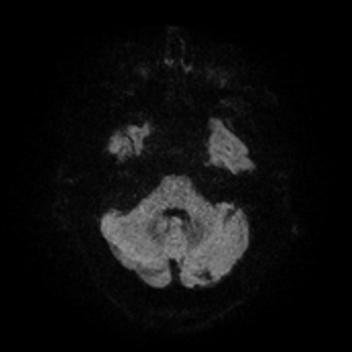
[im 48/96]
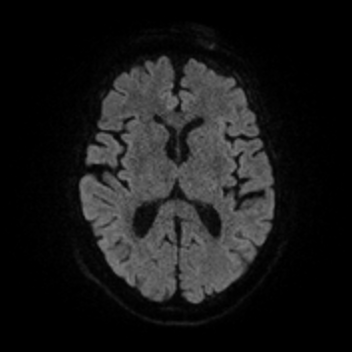
[im 72/96]
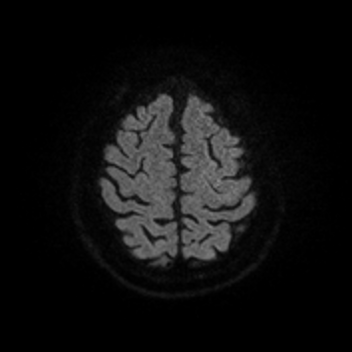
[im 96/96]
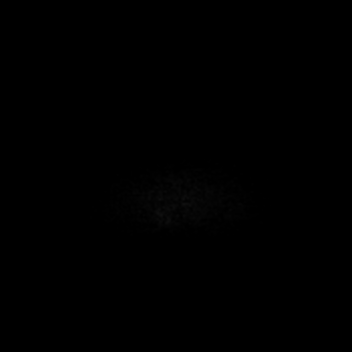

[Series 6: ax dwi_adc · axial · 3.0mm · 0.65mm/px · z∈[-82,+63]mm · 2 of 48 slices shown]
[im 1/48]
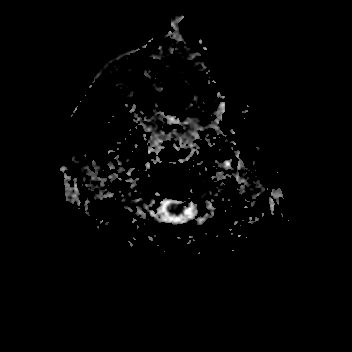
[im 48/48]
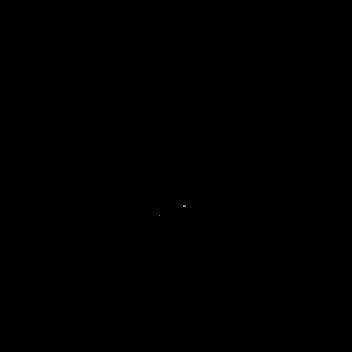

[Series 7: cor dwi_tracew · coronal · 5.0mm · 0.60mm/px · 5 of 76 slices shown]
[im 1/76]
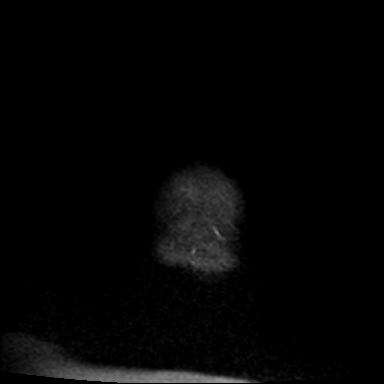
[im 19/76]
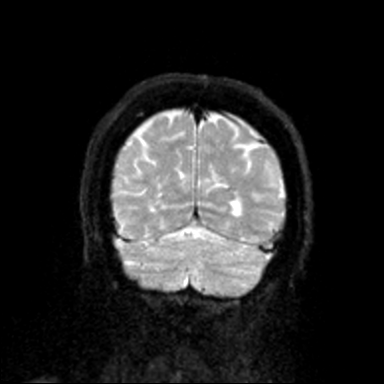
[im 38/76]
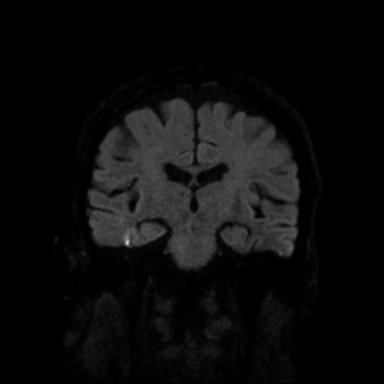
[im 57/76]
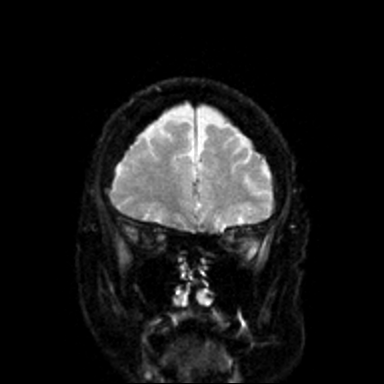
[im 76/76]
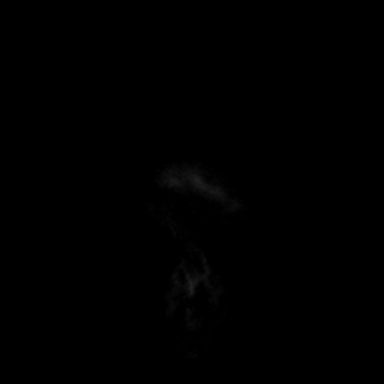

[Series 8: cor dwi_adc · coronal · 5.0mm · 0.60mm/px · 2 of 38 slices shown]
[im 1/38]
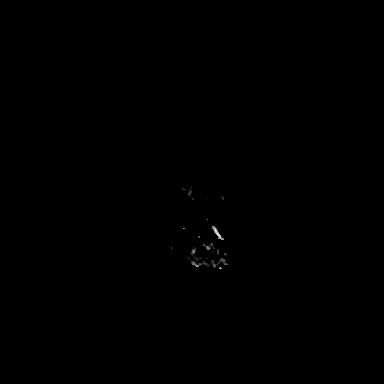
[im 38/38]
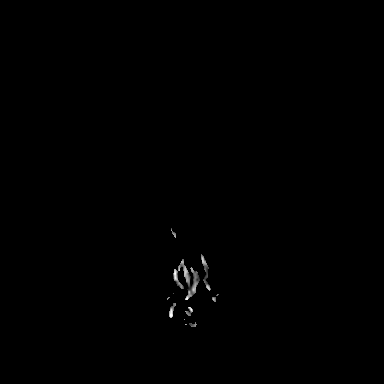

[Series 9: T1 · sagittal · 5.0mm · 0.62mm/px · 2 of 25 slices shown (1 of 2)]
[im 1/25]
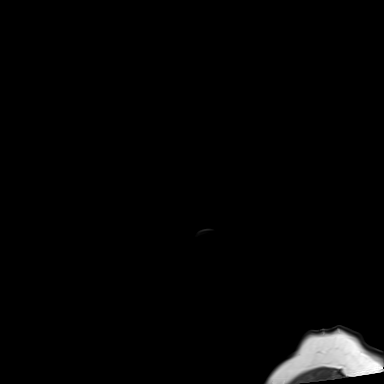
[im 25/25]
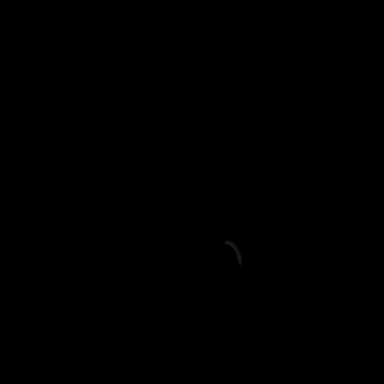

[Series 10: T2 · axial · 5.0mm · 0.53mm/px · z∈[-78,+58]mm · 2 of 25 slices shown (1 of 2)]
[im 1/25]
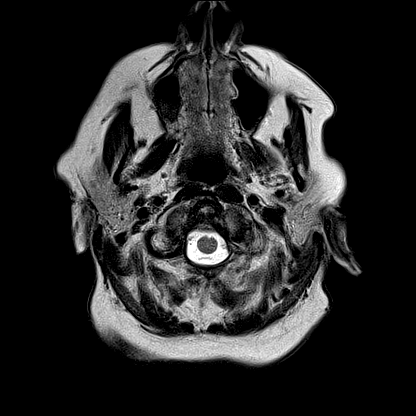
[im 25/25]
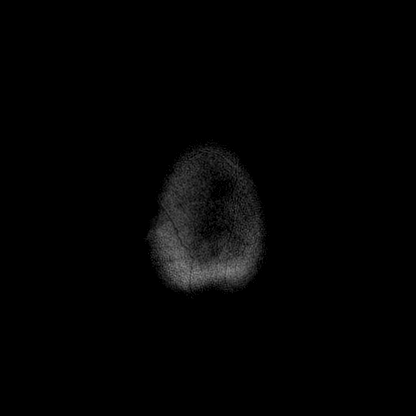

[Series 11: mag_images · axial · 3.0mm · 0.90mm/px · z∈[-91,+76]mm · 4 of 60 slices shown]
[im 1/60]
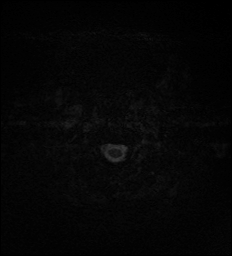
[im 20/60]
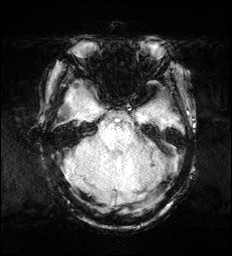
[im 40/60]
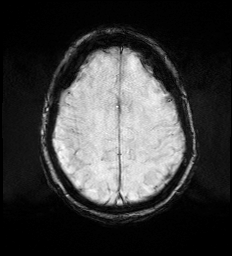
[im 60/60]
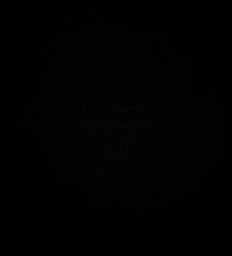

[Series 12: pha_images · axial · 3.0mm · 0.90mm/px · z∈[-91,+73]mm · 4 of 58 slices shown]
[im 1/58]
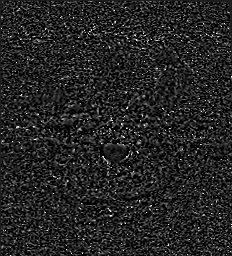
[im 20/58]
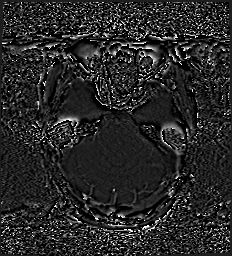
[im 39/58]
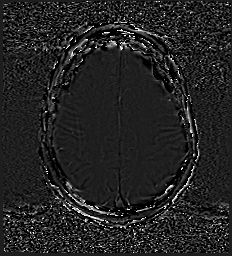
[im 58/58]
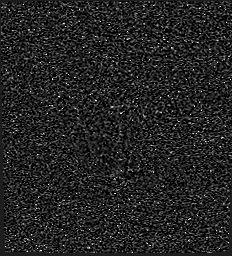

[Series 13: swi_images · axial · 3.0mm · 0.90mm/px · z∈[-91,+76]mm · 4 of 60 slices shown]
[im 1/60]
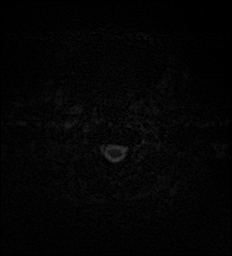
[im 20/60]
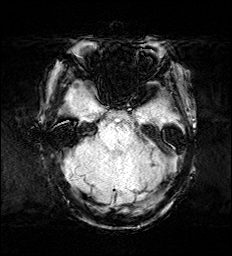
[im 40/60]
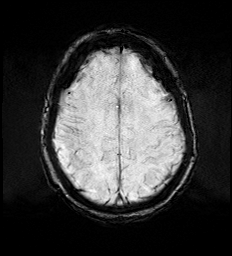
[im 60/60]
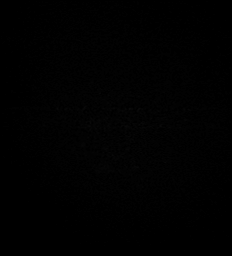

[Series 14: mip_images(sw) · axial · 24.0mm · 0.90mm/px · z∈[-81,+66]mm · 3 of 53 slices shown]
[im 1/53]
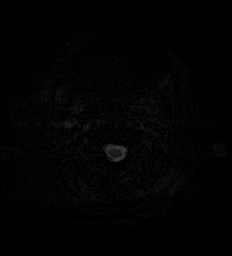
[im 27/53]
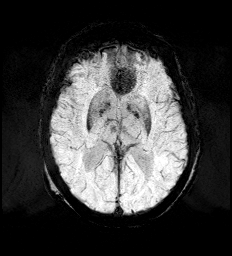
[im 53/53]
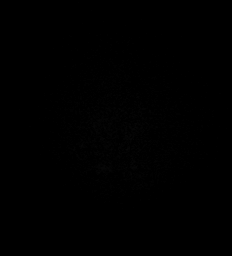

[Series 15: FLAIR · axial · 3.0mm · 0.53mm/px · z∈[-86,+66]mm · 3 of 55 slices shown]
[im 1/55]
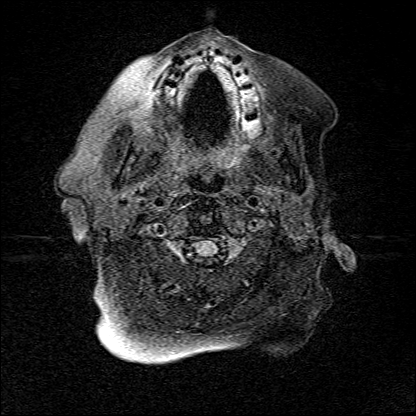
[im 28/55]
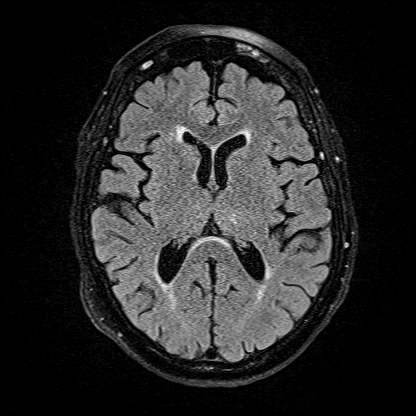
[im 55/55]
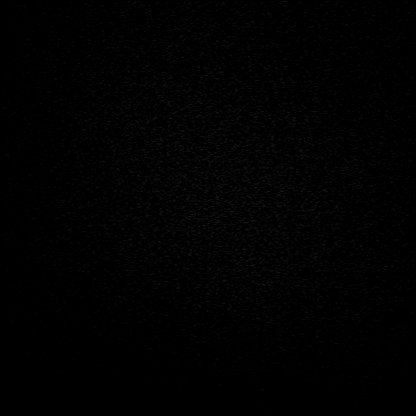

[Series 16: T1 · axial · 1.0mm · 0.98mm/px · z∈[-81,+68]mm · 8 of 160 slices shown (2 of 2)]
[im 1/160]
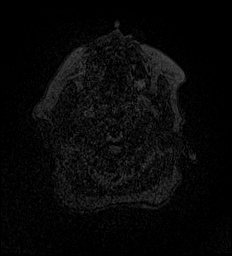
[im 18/160]
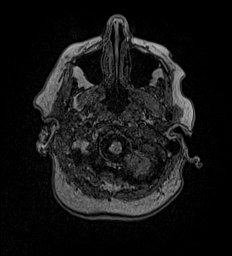
[im 54/160]
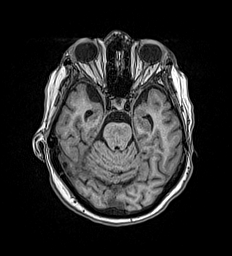
[im 71/160]
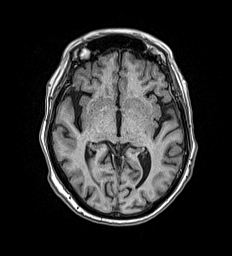
[im 89/160]
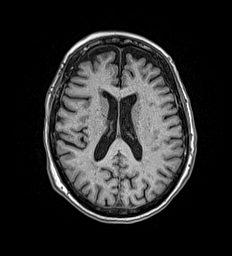
[im 107/160]
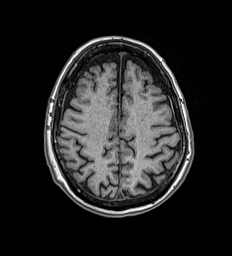
[im 142/160]
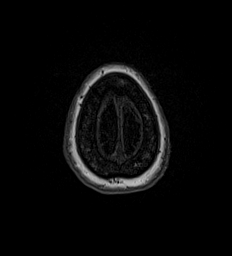
[im 160/160]
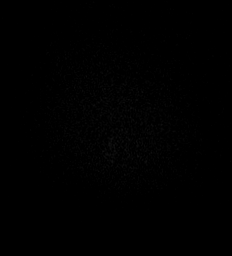

[Series 17: T2 · coronal · 5.0mm · 0.57mm/px · 2 of 29 slices shown (2 of 2)]
[im 1/29]
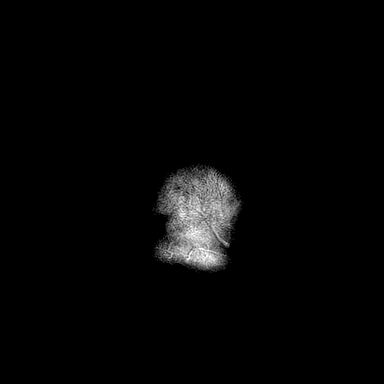
[im 29/29]
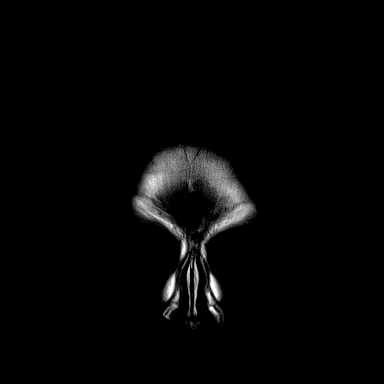

[46 of 48 positions shown; findings below may reference images not displayed]

FINDINGS: Brain:

Mild cerebral and cerebellar atrophy.

Redemonstrated chronic lacunar infarcts within the right caudate
head, bilateral thalami and bilateral cerebellar hemispheres.

Background mild multifocal T2/FLAIR hyperintensity within the
cerebral white matter and within the pons, nonspecific but
compatible with chronic small vessel ischemic disease.

There is no acute infarct.

No evidence of intracranial mass.

No chronic intracranial blood products.

No extra-axial fluid collection.

No midline shift.

Vascular: Expected proximal arterial flow voids.

Skull and upper cervical spine: No focal marrow lesion.

Sinuses/Orbits: Visualized orbits show no acute finding. No
significant paranasal sinus disease.
IMPRESSION: No evidence of acute intracranial abnormality.

Redemonstrated chronic lacunar infarcts within right caudate head,
bilateral thalami and bilateral cerebellar hemispheres.

Background mild generalized parenchymal atrophy and chronic small
vessel ischemic disease, stable as compared to the brain MRI of
02/09/2019.
